# Patient Record
Sex: Male | Born: 1985 | Race: Black or African American | Hispanic: No | Marital: Married | Smoking: Current every day smoker
Health system: Southern US, Community
[De-identification: ages and names within clinical notes are randomized; demographics above are authoritative.]

## PROBLEM LIST (undated history)

## (undated) DIAGNOSIS — K219 Gastro-esophageal reflux disease without esophagitis: Secondary | ICD-10-CM

## (undated) HISTORY — PX: COLOSTOMY: SHX63

## (undated) HISTORY — PX: ABDOMINAL SURGERY: SHX537

---
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
## 2006-10-11 ENCOUNTER — Emergency Department (HOSPITAL_COMMUNITY): Admission: EM | Admit: 2006-10-11 | Discharge: 2006-10-11 | Payer: Self-pay | Admitting: Family Medicine

## 2009-02-21 ENCOUNTER — Ambulatory Visit: Payer: Self-pay | Admitting: Internal Medicine

## 2009-02-21 ENCOUNTER — Encounter: Payer: Self-pay | Admitting: Internal Medicine

## 2009-02-21 DIAGNOSIS — F319 Bipolar disorder, unspecified: Secondary | ICD-10-CM | POA: Insufficient documentation

## 2009-02-21 DIAGNOSIS — M25569 Pain in unspecified knee: Secondary | ICD-10-CM

## 2009-02-21 DIAGNOSIS — J45909 Unspecified asthma, uncomplicated: Secondary | ICD-10-CM | POA: Insufficient documentation

## 2009-02-21 DIAGNOSIS — G40309 Generalized idiopathic epilepsy and epileptic syndromes, not intractable, without status epilepticus: Secondary | ICD-10-CM | POA: Insufficient documentation

## 2009-02-21 LAB — CONVERTED CEMR LAB
ALT: 23 units/L (ref 0–53)
AST: 27 units/L (ref 0–37)
Albumin: 5 g/dL (ref 3.5–5.2)
Alkaline Phosphatase: 48 units/L (ref 39–117)
BUN: 11 mg/dL (ref 6–23)
CO2: 23 meq/L (ref 19–32)
Calcium: 9.8 mg/dL (ref 8.4–10.5)
Chloride: 105 meq/L (ref 96–112)
Creatinine, Ser: 1.24 mg/dL (ref 0.40–1.50)
GFR calc Af Amer: >60 mL/min (ref >60)
GFR calc non Af Amer: >60 mL/min (ref >60)
Glucose, Bld: 89 mg/dL (ref 70–99)
HCT: 46.2 % (ref 39.0–52.0)
Hemoglobin: 15.4 g/dL (ref 13.0–17.0)
MCHC: 33.3 g/dL (ref 30.0–36.0)
MCV: 89.4 fL (ref 78.0–100.0)
Platelets: 347 10*3/uL (ref 150–400)
Potassium: 4.8 meq/L (ref 3.5–5.3)
RBC: 5.17 M/uL (ref 4.22–5.81)
RDW: 14.2 % (ref 11.5–15.5)
Rheumatoid fact SerPl-aCnc: <20 intl units/mL (ref 0–20)
Sed Rate: 2 mm/hr (ref 0–16)
Sodium: 141 meq/L (ref 135–145)
TSH: 1.164 microintl units/mL (ref 0.350–4.500)
Total Bilirubin: 0.2 mg/dL — ABNORMAL LOW (ref 0.3–1.2)
Total Protein: 8.5 g/dL — ABNORMAL HIGH (ref 6.0–8.3)
WBC: 6.6 10*3/uL (ref 4.0–10.5)

## 2009-03-24 ENCOUNTER — Encounter: Payer: Self-pay | Admitting: Internal Medicine

## 2009-04-10 ENCOUNTER — Encounter: Payer: Self-pay | Admitting: Internal Medicine

## 2009-04-28 ENCOUNTER — Emergency Department (HOSPITAL_COMMUNITY): Admission: EM | Admit: 2009-04-28 | Discharge: 2009-04-28 | Payer: Self-pay | Admitting: Family Medicine

## 2011-03-30 ENCOUNTER — Emergency Department (HOSPITAL_COMMUNITY): Payer: Self-pay

## 2011-03-30 ENCOUNTER — Emergency Department (HOSPITAL_COMMUNITY)
Admission: EM | Admit: 2011-03-30 | Discharge: 2011-03-30 | Disposition: A | Discharge status: Home / Self Care | Payer: Self-pay | Attending: Emergency Medicine | Admitting: Emergency Medicine

## 2011-03-30 DIAGNOSIS — F1010 Alcohol abuse, uncomplicated: Secondary | ICD-10-CM | POA: Insufficient documentation

## 2011-03-30 DIAGNOSIS — M62838 Other muscle spasm: Secondary | ICD-10-CM | POA: Insufficient documentation

## 2011-03-30 DIAGNOSIS — IMO0002 Reserved for concepts with insufficient information to code with codable children: Secondary | ICD-10-CM | POA: Insufficient documentation

## 2011-03-30 DIAGNOSIS — W1809XA Striking against other object with subsequent fall, initial encounter: Secondary | ICD-10-CM | POA: Insufficient documentation

## 2011-03-30 DIAGNOSIS — R000 Tachycardia, unspecified: Secondary | ICD-10-CM | POA: Insufficient documentation

## 2011-04-03 ENCOUNTER — Other Ambulatory Visit (INDEPENDENT_AMBULATORY_CARE_PROVIDER_SITE_OTHER): Payer: Self-pay | Admitting: General Surgery

## 2011-04-03 ENCOUNTER — Emergency Department (HOSPITAL_COMMUNITY): Payer: No Typology Code available for payment source

## 2011-04-03 ENCOUNTER — Inpatient Hospital Stay (HOSPITAL_COMMUNITY)
Admission: EM | Admit: 2011-04-03 | Discharge: 2011-04-15 | DRG: 329 | Disposition: A | Discharge status: Home / Self Care | Payer: No Typology Code available for payment source | Attending: General Surgery | Admitting: General Surgery

## 2011-04-03 DIAGNOSIS — D62 Acute posthemorrhagic anemia: Secondary | ICD-10-CM | POA: Diagnosis present

## 2011-04-03 DIAGNOSIS — W3400XA Accidental discharge from unspecified firearms or gun, initial encounter: Secondary | ICD-10-CM

## 2011-04-03 DIAGNOSIS — Y998 Other external cause status: Secondary | ICD-10-CM

## 2011-04-03 DIAGNOSIS — S31809A Unspecified open wound of unspecified buttock, initial encounter: Secondary | ICD-10-CM | POA: Diagnosis present

## 2011-04-03 DIAGNOSIS — F17200 Nicotine dependence, unspecified, uncomplicated: Secondary | ICD-10-CM | POA: Diagnosis present

## 2011-04-03 DIAGNOSIS — S81009A Unspecified open wound, unspecified knee, initial encounter: Secondary | ICD-10-CM | POA: Diagnosis present

## 2011-04-03 DIAGNOSIS — S61409A Unspecified open wound of unspecified hand, initial encounter: Secondary | ICD-10-CM | POA: Diagnosis present

## 2011-04-03 DIAGNOSIS — K560 Paralytic ileus: Secondary | ICD-10-CM | POA: Diagnosis present

## 2011-04-03 DIAGNOSIS — K661 Hemoperitoneum: Secondary | ICD-10-CM | POA: Diagnosis present

## 2011-04-03 DIAGNOSIS — S36899A Unspecified injury of other intra-abdominal organs, initial encounter: Principal | ICD-10-CM | POA: Diagnosis present

## 2011-04-03 DIAGNOSIS — M795 Residual foreign body in soft tissue: Secondary | ICD-10-CM | POA: Diagnosis present

## 2011-04-03 DIAGNOSIS — S81809A Unspecified open wound, unspecified lower leg, initial encounter: Secondary | ICD-10-CM | POA: Diagnosis present

## 2011-04-03 DIAGNOSIS — S62309A Unspecified fracture of unspecified metacarpal bone, initial encounter for closed fracture: Secondary | ICD-10-CM | POA: Diagnosis present

## 2011-04-03 DIAGNOSIS — S31109A Unspecified open wound of abdominal wall, unspecified quadrant without penetration into peritoneal cavity, initial encounter: Secondary | ICD-10-CM | POA: Diagnosis present

## 2011-04-03 DIAGNOSIS — Z189 Retained foreign body fragments, unspecified material: Secondary | ICD-10-CM

## 2011-04-03 DIAGNOSIS — R000 Tachycardia, unspecified: Secondary | ICD-10-CM | POA: Diagnosis present

## 2011-04-03 LAB — COMPREHENSIVE METABOLIC PANEL
ALT: 27 U/L (ref 0–53)
Albumin: 3.3 g/dL — ABNORMAL LOW (ref 3.5–5.2)
Alkaline Phosphatase: 40 U/L (ref 39–117)
Potassium: 3 meq/L — ABNORMAL LOW (ref 3.5–5.1)
Sodium: 141 meq/L (ref 135–145)
Total Protein: 6 g/dL (ref 6.0–8.3)

## 2011-04-03 LAB — PROTIME-INR
INR: 1.09 (ref 0.00–1.49)
Prothrombin Time: 14.3 s (ref 11.6–15.2)

## 2011-04-03 LAB — POCT I-STAT, CHEM 8
HCT: 37 % — ABNORMAL LOW (ref 39.0–52.0)
Hemoglobin: 12.6 g/dL — ABNORMAL LOW (ref 13.0–17.0)
Potassium: 3.1 meq/L — ABNORMAL LOW (ref 3.5–5.1)
Sodium: 144 meq/L (ref 135–145)

## 2011-04-03 LAB — CBC
Hemoglobin: 11.5 g/dL — ABNORMAL LOW (ref 13.0–17.0)
RBC: 3.88 MIL/uL — ABNORMAL LOW (ref 4.22–5.81)
WBC: 8.5 10*3/uL (ref 4.0–10.5)

## 2011-04-04 ENCOUNTER — Inpatient Hospital Stay (HOSPITAL_COMMUNITY): Payer: No Typology Code available for payment source

## 2011-04-04 ENCOUNTER — Telehealth: Payer: Self-pay | Admitting: Internal Medicine

## 2011-04-04 DIAGNOSIS — S36409A Unspecified injury of unspecified part of small intestine, initial encounter: Secondary | ICD-10-CM

## 2011-04-04 DIAGNOSIS — S36509A Unspecified injury of unspecified part of colon, initial encounter: Secondary | ICD-10-CM

## 2011-04-04 LAB — BASIC METABOLIC PANEL
BUN: 8 mg/dL (ref 6–23)
CO2: 22 mEq/L (ref 19–32)
GFR calc non Af Amer: >60 mL/min (ref >60)
Glucose, Bld: 116 mg/dL — ABNORMAL HIGH (ref 70–99)
Potassium: 4 mEq/L (ref 3.5–5.1)

## 2011-04-04 LAB — CBC
HCT: 33.2 % — ABNORMAL LOW (ref 39.0–52.0)
Hemoglobin: 11.6 g/dL — ABNORMAL LOW (ref 13.0–17.0)
MCH: 30.2 pg (ref 26.0–34.0)
MCHC: 34.9 g/dL (ref 30.0–36.0)
RBC: 3.84 MIL/uL — ABNORMAL LOW (ref 4.22–5.81)

## 2011-04-04 LAB — ABO/RH: ABO/RH(D): A POS
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
## 2011-04-05 LAB — CBC
HCT: 30.2 % — ABNORMAL LOW (ref 39.0–52.0)
MCH: 30.4 pg (ref 26.0–34.0)
MCV: 85.8 fL (ref 78.0–100.0)
Platelets: 134 10*3/uL — ABNORMAL LOW (ref 150–400)
RBC: 3.52 MIL/uL — ABNORMAL LOW (ref 4.22–5.81)

## 2011-04-06 DIAGNOSIS — D62 Acute posthemorrhagic anemia: Secondary | ICD-10-CM

## 2011-04-06 LAB — DIFFERENTIAL
Eosinophils Absolute: 0.1 10*3/uL (ref 0.0–0.7)
Eosinophils Relative: 1 % (ref 0–5)
Lymphs Abs: 1.2 10*3/uL (ref 0.7–4.0)
Monocytes Absolute: 0.6 10*3/uL (ref 0.1–1.0)
Monocytes Relative: 7 % (ref 3–12)

## 2011-04-06 LAB — CBC
HCT: 25.4 % — ABNORMAL LOW (ref 39.0–52.0)
MCH: 30.2 pg (ref 26.0–34.0)
MCHC: 35.4 g/dL (ref 30.0–36.0)
MCV: 85.2 fL (ref 78.0–100.0)
Platelets: 141 10*3/uL — ABNORMAL LOW (ref 150–400)
RDW: 13.4 % (ref 11.5–15.5)
WBC: 8.5 10*3/uL (ref 4.0–10.5)

## 2011-04-06 LAB — POCT I-STAT 4, (NA,K, GLUC, HGB,HCT): Sodium: 144 meq/L (ref 135–145)

## 2011-04-06 LAB — BASIC METABOLIC PANEL
BUN: 5 mg/dL — ABNORMAL LOW (ref 6–23)
Calcium: 8.6 mg/dL (ref 8.4–10.5)
Creatinine, Ser: 0.98 mg/dL (ref 0.50–1.35)
GFR calc Af Amer: >60 mL/min (ref >60)

## 2011-04-07 LAB — TYPE AND SCREEN
ABO/RH(D): A POS
Antibody Screen: NEGATIVE
Unit division: 0
Unit division: 0
Unit division: 0
Unit division: 0

## 2011-04-07 LAB — CBC
HCT: 24.9 % — ABNORMAL LOW (ref 39.0–52.0)
Hemoglobin: 8.5 g/dL — ABNORMAL LOW (ref 13.0–17.0)
MCH: 29.3 pg (ref 26.0–34.0)
MCHC: 34.1 g/dL (ref 30.0–36.0)
RBC: 2.9 MIL/uL — ABNORMAL LOW (ref 4.22–5.81)

## 2011-04-08 LAB — CBC
HCT: 25.6 % — ABNORMAL LOW (ref 39.0–52.0)
MCH: 30.1 pg (ref 26.0–34.0)
MCV: 85.6 fL (ref 78.0–100.0)
Platelets: ADEQUATE 10*3/uL (ref 150–400)
RBC: 2.99 MIL/uL — ABNORMAL LOW (ref 4.22–5.81)
RDW: 13.1 % (ref 11.5–15.5)
WBC: 7.6 10*3/uL (ref 4.0–10.5)

## 2011-04-09 ENCOUNTER — Inpatient Hospital Stay (HOSPITAL_COMMUNITY): Payer: No Typology Code available for payment source

## 2011-04-11 ENCOUNTER — Inpatient Hospital Stay (HOSPITAL_COMMUNITY): Payer: No Typology Code available for payment source

## 2011-04-11 LAB — CBC
HCT: 28.5 % — ABNORMAL LOW (ref 39.0–52.0)
Hemoglobin: 9.7 g/dL — ABNORMAL LOW (ref 13.0–17.0)
MCH: 29.5 pg (ref 26.0–34.0)
MCHC: 34 g/dL (ref 30.0–36.0)
RDW: 13.4 % (ref 11.5–15.5)

## 2011-04-11 LAB — BASIC METABOLIC PANEL
Calcium: 9.8 mg/dL (ref 8.4–10.5)
Chloride: 99 mEq/L (ref 96–112)
Creatinine, Ser: 1.11 mg/dL (ref 0.50–1.35)
GFR calc Af Amer: >60 mL/min (ref >60)
Sodium: 140 mEq/L (ref 135–145)

## 2011-04-11 LAB — URINALYSIS, ROUTINE W REFLEX MICROSCOPIC
Hgb urine dipstick: NEGATIVE
Specific Gravity, Urine: 1.021 (ref 1.005–1.030)
Urobilinogen, UA: 1 mg/dL (ref 0.0–1.0)
pH: 8.5 — ABNORMAL HIGH (ref 5.0–8.0)

## 2011-04-11 LAB — URINE MICROSCOPIC-ADD ON

## 2011-04-12 ENCOUNTER — Inpatient Hospital Stay (HOSPITAL_COMMUNITY): Payer: No Typology Code available for payment source

## 2011-04-12 LAB — BASIC METABOLIC PANEL
CO2: 29 mEq/L (ref 19–32)
Calcium: 9.9 mg/dL (ref 8.4–10.5)
Creatinine, Ser: 1.05 mg/dL (ref 0.50–1.35)
Glucose, Bld: 136 mg/dL — ABNORMAL HIGH (ref 70–99)

## 2011-04-12 LAB — CBC
HCT: 30.2 % — ABNORMAL LOW (ref 39.0–52.0)
Platelets: 706 10*3/uL — ABNORMAL HIGH (ref 150–400)
RBC: 3.47 MIL/uL — ABNORMAL LOW (ref 4.22–5.81)
RDW: 13.7 % (ref 11.5–15.5)
WBC: 11.3 10*3/uL — ABNORMAL HIGH (ref 4.0–10.5)

## 2011-04-12 LAB — URINE CULTURE: Culture  Setup Time: 201208051431
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
## 2011-04-14 LAB — CBC
MCH: 29.6 pg (ref 26.0–34.0)
MCHC: 34 g/dL (ref 30.0–36.0)
MCV: 87.2 fL (ref 78.0–100.0)
Platelets: 645 10*3/uL — ABNORMAL HIGH (ref 150–400)
RDW: 13.7 % (ref 11.5–15.5)

## 2011-04-14 LAB — COMPREHENSIVE METABOLIC PANEL
AST: 26 U/L (ref 0–37)
Albumin: 2.8 g/dL — ABNORMAL LOW (ref 3.5–5.2)
Calcium: 8.9 mg/dL (ref 8.4–10.5)
Creatinine, Ser: 0.91 mg/dL (ref 0.50–1.35)
GFR calc non Af Amer: >60 mL/min (ref >60)
Total Protein: 5.9 g/dL — ABNORMAL LOW (ref 6.0–8.3)
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
## 2011-04-29 ENCOUNTER — Ambulatory Visit (INDEPENDENT_AMBULATORY_CARE_PROVIDER_SITE_OTHER): Payer: No Typology Code available for payment source | Admitting: Physician Assistant

## 2011-04-29 DIAGNOSIS — S36509A Unspecified injury of unspecified part of colon, initial encounter: Secondary | ICD-10-CM

## 2011-04-29 DIAGNOSIS — IMO0002 Reserved for concepts with insufficient information to code with codable children: Secondary | ICD-10-CM

## 2011-04-29 DIAGNOSIS — M792 Neuralgia and neuritis, unspecified: Secondary | ICD-10-CM | POA: Insufficient documentation

## 2011-04-29 MED ORDER — OXYCODONE-ACETAMINOPHEN 5-325 MG PO TABS: 1.0000 | ORAL_TABLET | ORAL | Status: DC | PRN

## 2011-04-29 MED ORDER — GABAPENTIN 300 MG PO CAPS: 300.0000 mg | ORAL_CAPSULE | Freq: Three times a day (TID) | ORAL | Status: DC
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
## 2011-04-29 NOTE — Patient Instructions (Signed)
Start Neurontin for nerve pain down the Right leg.  Follow up with Trauma Service in late October for possible colostomy reversal  Call [phone] for appointment.

## 2011-05-11 ENCOUNTER — Telehealth (INDEPENDENT_AMBULATORY_CARE_PROVIDER_SITE_OTHER): Payer: Self-pay | Admitting: Orthopedic Surgery

## 2011-05-11 NOTE — Telephone Encounter (Signed)
Had questions regarding passing gas and stool out of rectum with a colostomy.

## 2011-05-11 NOTE — Telephone Encounter (Signed)
Called with concerns regarding gas and stool per rectum. I explained these were normal, pt was reassured.

## 2011-06-11 ENCOUNTER — Telehealth (INDEPENDENT_AMBULATORY_CARE_PROVIDER_SITE_OTHER): Payer: Self-pay | Admitting: Physician Assistant
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
## 2011-06-18 ENCOUNTER — Telehealth (INDEPENDENT_AMBULATORY_CARE_PROVIDER_SITE_OTHER): Payer: Self-pay | Admitting: Orthopedic Surgery

## 2011-06-18 NOTE — Telephone Encounter (Signed)
Patient's mother called to set up appointment for possible colostomy reversal. I saw where she had been told to call in October but I spoke with Dr. Janee Morn about it and he wanted to wait the full 6 months. He suggested appointment in late December with plan to operate sometime in January. I communicated this to the patient's mother and apologized for the confusion.

## 2011-06-21 ENCOUNTER — Telehealth (INDEPENDENT_AMBULATORY_CARE_PROVIDER_SITE_OTHER): Payer: Self-pay | Admitting: Orthopedic Surgery
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
## 2011-08-24 ENCOUNTER — Telehealth: Payer: Self-pay | Admitting: Orthopedic Surgery

## 2011-08-24 NOTE — Telephone Encounter (Signed)
Left message for pt to call back. I made him an appointment with Dr. Janee Morn for January 16th @ 9:30 to discuss colostomy reversal. He will need to speak with financial counseling at [facility] prior to that appointment.

## 2011-09-08 ENCOUNTER — Telehealth: Payer: Self-pay | Admitting: Orthopedic Surgery

## 2011-09-08 NOTE — Telephone Encounter (Signed)
Called about colostomy reversal

## 2011-09-10 ENCOUNTER — Telehealth: Payer: Self-pay | Admitting: Orthopedic Surgery

## 2011-09-10 NOTE — Telephone Encounter (Signed)
Left message to call [facility] to change appointment times.

## 2011-09-22 ENCOUNTER — Encounter (INDEPENDENT_AMBULATORY_CARE_PROVIDER_SITE_OTHER): Payer: Self-pay | Admitting: General Surgery

## 2011-09-22 ENCOUNTER — Ambulatory Visit (INDEPENDENT_AMBULATORY_CARE_PROVIDER_SITE_OTHER): Payer: No Typology Code available for payment source | Admitting: General Surgery

## 2011-09-22 VITALS — BP 108/56 | HR 74 | Temp 99.1°F | Ht 70.0 in | Wt 185.2 lb

## 2011-09-22 DIAGNOSIS — Z933 Colostomy status: Secondary | ICD-10-CM
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
## 2011-09-28 ENCOUNTER — Ambulatory Visit
Admission: RE | Admit: 2011-09-28 | Discharge: 2011-09-28 | Disposition: A | Discharge status: Home / Self Care | Payer: No Typology Code available for payment source | Source: Ambulatory Visit | Attending: General Surgery | Admitting: General Surgery

## 2011-09-28 DIAGNOSIS — Z933 Colostomy status: Secondary | ICD-10-CM

## 2011-09-30 ENCOUNTER — Other Ambulatory Visit (INDEPENDENT_AMBULATORY_CARE_PROVIDER_SITE_OTHER): Payer: Self-pay | Admitting: General Surgery

## 2011-10-04 ENCOUNTER — Telehealth (INDEPENDENT_AMBULATORY_CARE_PROVIDER_SITE_OTHER): Payer: Self-pay
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
## 2011-11-19 ENCOUNTER — Other Ambulatory Visit (HOSPITAL_COMMUNITY): Payer: Self-pay

## 2011-11-19 ENCOUNTER — Telehealth: Payer: Self-pay | Admitting: Orthopedic Surgery

## 2011-11-19 ENCOUNTER — Telehealth (INDEPENDENT_AMBULATORY_CARE_PROVIDER_SITE_OTHER): Payer: Self-pay
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
## 2011-11-25 ENCOUNTER — Encounter (INDEPENDENT_AMBULATORY_CARE_PROVIDER_SITE_OTHER): Payer: Self-pay | Admitting: Orthopedic Surgery

## 2011-11-25 ENCOUNTER — Ambulatory Visit (INDEPENDENT_AMBULATORY_CARE_PROVIDER_SITE_OTHER): Payer: Self-pay | Admitting: Orthopedic Surgery

## 2011-11-25 VITALS — BP 130/84 | HR 70 | Temp 97.8°F | Resp 18 | Ht 69.0 in | Wt 192.2 lb

## 2011-11-25 DIAGNOSIS — IMO0002 Reserved for concepts with insufficient information to code with codable children: Secondary | ICD-10-CM

## 2011-11-25 DIAGNOSIS — L089 Local infection of the skin and subcutaneous tissue, unspecified: Secondary | ICD-10-CM

## 2011-11-25 MED ORDER — ACETAMINOPHEN 325 MG PO TABS: 650.0000 mg | ORAL_TABLET | ORAL | Status: DC | PRN

## 2011-11-25 MED ORDER — CEPHALEXIN 500 MG PO CAPS: 500.0000 mg | ORAL_CAPSULE | Freq: Four times a day (QID) | ORAL | Status: AC

## 2011-11-25 MED ORDER — HYDROCODONE-ACETAMINOPHEN 5-325 MG PO TABS: 1.0000 | ORAL_TABLET | Freq: Four times a day (QID) | ORAL | Status: AC | PRN

## 2011-11-25 NOTE — Patient Instructions (Signed)
Twice daily, remove packing and wash with soap and water. Do not soak. Then replace packing with 1/4" plain gauze and cover with dry dressing.
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
## 2011-12-02 ENCOUNTER — Ambulatory Visit (INDEPENDENT_AMBULATORY_CARE_PROVIDER_SITE_OTHER): Payer: No Typology Code available for payment source | Admitting: Physician Assistant

## 2011-12-02 VITALS — BP 120/84 | HR 70 | Temp 97.8°F | Resp 18 | Ht 70.0 in | Wt 189.4 lb

## 2011-12-02 DIAGNOSIS — S70259A Superficial foreign body, unspecified hip, initial encounter: Secondary | ICD-10-CM

## 2011-12-02 DIAGNOSIS — S70251A Superficial foreign body, right hip, initial encounter: Secondary | ICD-10-CM
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
## 2011-12-02 NOTE — Patient Instructions (Signed)
Dry dressing over right hip until healed.

## 2011-12-07 ENCOUNTER — Encounter (HOSPITAL_COMMUNITY): Payer: Self-pay | Admitting: Pharmacy Technician

## 2011-12-10 NOTE — Pre-Procedure Instructions (Signed)
20 Phillip Mcgee  12/10/2011   Your procedure is scheduled on:  April 15  Report to [facility] at 0530 AM.  Call this number if you have problems the morning of surgery: [phone]   Remember:   Do not eat food:After Midnight.  May have clear liquids: up to 4 Hours before arrival.  Clear liquids include soda, tea, black coffee, apple or grape juice, broth.  Take these medicines the morning of surgery with A SIP OF WATER: oxycodone   Do not wear jewelry, make-up or nail polish.  Do not wear lotions, powders, or perfumes. You may wear deodorant.  Do not shave 48 hours prior to surgery.  Do not bring valuables to the hospital.  Contacts, dentures or bridgework may not be worn into surgery.  Leave suitcase in the car. After surgery it may be brought to your room.  For patients admitted to the hospital, checkout time is 11:00 AM the day of discharge.   Patients discharged the day of surgery will not be allowed to drive home.  Name and phone number of your driver: family  Special Instructions: CHG Shower Use Special Wash: 1/2 bottle night before surgery and 1/2 bottle morning of surgery.   Please read over the following fact sheets that you were given: Coughing and Deep Breathing, MRSA Information and Surgical Site Infection Prevention

## 2011-12-13 ENCOUNTER — Telehealth (INDEPENDENT_AMBULATORY_CARE_PROVIDER_SITE_OTHER): Payer: Self-pay

## 2011-12-13 ENCOUNTER — Inpatient Hospital Stay (HOSPITAL_COMMUNITY): Admission: RE | Admit: 2011-12-13 | Discharge: 2011-12-13 | Payer: Self-pay | Source: Ambulatory Visit

## 2011-12-13 ENCOUNTER — Other Ambulatory Visit: Payer: Self-pay | Admitting: General Surgery

## 2011-12-13 NOTE — Telephone Encounter (Signed)
I called the pt to check and see if he had his bowel prep information and prescriptions.  He does and has had a hard time finding a Pharmacy that has the Golytely but [facility] does.  He will get that filled.

## 2011-12-13 NOTE — Progress Notes (Signed)
Requested new current orders for preop. (orders from 09/2011 in epic).

## 2011-12-16 ENCOUNTER — Encounter (HOSPITAL_COMMUNITY): Payer: Self-pay

## 2011-12-16 ENCOUNTER — Telehealth (INDEPENDENT_AMBULATORY_CARE_PROVIDER_SITE_OTHER): Payer: Self-pay

## 2011-12-16 ENCOUNTER — Encounter (HOSPITAL_COMMUNITY)
Admission: RE | Admit: 2011-12-16 | Discharge: 2011-12-16 | Disposition: A | Discharge status: Home / Self Care | Payer: Self-pay | Source: Ambulatory Visit | Attending: General Surgery | Admitting: General Surgery

## 2011-12-16 LAB — BASIC METABOLIC PANEL
Calcium: 9.9 mg/dL (ref 8.4–10.5)
Chloride: 102 mEq/L (ref 96–112)
Creatinine, Ser: 0.95 mg/dL (ref 0.50–1.35)
GFR calc Af Amer: >90 mL/min (ref >90)

## 2011-12-16 LAB — CBC
Platelets: 287 10*3/uL (ref 150–400)
RDW: 13.9 % (ref 11.5–15.5)
WBC: 8.8 10*3/uL (ref 4.0–10.5)

## 2011-12-16 LAB — SURGICAL PCR SCREEN: Staphylococcus aureus: NEGATIVE

## 2011-12-16 NOTE — Telephone Encounter (Signed)
Pt request the golytley be called to [facility] [address] in [place] [phone]. Pt states he did pick up the antibiotics at [facility] but they did not have colon prep. Pt request we call it in to [place] instead. Golytley colon prep called to [facility] with instruction to begin prep at 8am the day before surgery.

## 2011-12-19 MED ORDER — SODIUM CHLORIDE 0.9 % IV SOLN
1.0000 g | INTRAVENOUS | Status: AC
  Administered 2011-12-20: 1 g via INTRAVENOUS
  Filled 2011-12-19: qty 1

## 2011-12-19 MED ORDER — ALVIMOPAN 12 MG PO CAPS
12.0000 mg | ORAL_CAPSULE | Freq: Once | ORAL | Status: AC
  Administered 2011-12-20: 12 mg via ORAL
  Filled 2011-12-19: qty 1

## 2011-12-20 ENCOUNTER — Encounter (HOSPITAL_COMMUNITY): Admission: RE | Disposition: A | Discharge status: Home / Self Care | Payer: Self-pay | Source: Ambulatory Visit

## 2011-12-20 ENCOUNTER — Encounter (HOSPITAL_COMMUNITY): Payer: Self-pay

## 2011-12-20 ENCOUNTER — Inpatient Hospital Stay (HOSPITAL_COMMUNITY)
Admission: RE | Admit: 2011-12-20 | Discharge: 2011-12-25 | DRG: 345 | Disposition: A | Discharge status: Home / Self Care | Payer: Self-pay | Source: Ambulatory Visit | Attending: General Surgery | Admitting: General Surgery

## 2011-12-20 ENCOUNTER — Encounter (HOSPITAL_COMMUNITY): Payer: Self-pay | Admitting: Anesthesiology

## 2011-12-20 ENCOUNTER — Ambulatory Visit (HOSPITAL_COMMUNITY): Payer: Self-pay | Admitting: Anesthesiology

## 2011-12-20 DIAGNOSIS — F319 Bipolar disorder, unspecified: Secondary | ICD-10-CM | POA: Diagnosis present

## 2011-12-20 DIAGNOSIS — Z433 Encounter for attention to colostomy: Principal | ICD-10-CM | POA: Diagnosis present

## 2011-12-20 DIAGNOSIS — IMO0002 Reserved for concepts with insufficient information to code with codable children: Secondary | ICD-10-CM

## 2011-12-20 DIAGNOSIS — J45909 Unspecified asthma, uncomplicated: Secondary | ICD-10-CM | POA: Diagnosis present

## 2011-12-20 DIAGNOSIS — M25569 Pain in unspecified knee: Secondary | ICD-10-CM | POA: Diagnosis present

## 2011-12-20 DIAGNOSIS — K929 Disease of digestive system, unspecified: Secondary | ICD-10-CM | POA: Diagnosis not present

## 2011-12-20 DIAGNOSIS — F17200 Nicotine dependence, unspecified, uncomplicated: Secondary | ICD-10-CM | POA: Diagnosis present

## 2011-12-20 DIAGNOSIS — G589 Mononeuropathy, unspecified: Secondary | ICD-10-CM | POA: Diagnosis present

## 2011-12-20 DIAGNOSIS — K560 Paralytic ileus: Secondary | ICD-10-CM | POA: Diagnosis not present

## 2011-12-20 DIAGNOSIS — Y832 Surgical operation with anastomosis, bypass or graft as the cause of abnormal reaction of the patient, or of later complication, without mention of misadventure at the time of the procedure: Secondary | ICD-10-CM | POA: Diagnosis not present

## 2011-12-20 DIAGNOSIS — Z01812 Encounter for preprocedural laboratory examination: Secondary | ICD-10-CM

## 2011-12-20 HISTORY — DX: Gastro-esophageal reflux disease without esophagitis: K21.9

## 2011-12-20 HISTORY — PX: COLOSTOMY CLOSURE: SHX1381

## 2011-12-20 SURGERY — COLOSTOMY CLOSURE
Anesthesia: General | Site: Abdomen | Wound class: Clean Contaminated

## 2011-12-20 MED ORDER — HYDROMORPHONE 0.3 MG/ML IV SOLN
INTRAVENOUS | Status: AC
  Filled 2011-12-20: qty 25

## 2011-12-20 MED ORDER — ONDANSETRON HCL 4 MG/2ML IJ SOLN
INTRAMUSCULAR | Status: DC | PRN
  Administered 2011-12-20: 4 mg via INTRAVENOUS

## 2011-12-20 MED ORDER — MORPHINE SULFATE 10 MG/ML IJ SOLN
INTRAMUSCULAR | Status: DC | PRN
  Administered 2011-12-20: 1 mg via INTRAVENOUS
  Administered 2011-12-20: 5 mg via INTRAVENOUS
  Administered 2011-12-20 (×2): 2 mg via INTRAVENOUS

## 2011-12-20 MED ORDER — DIPHENHYDRAMINE HCL 12.5 MG/5ML PO ELIX
12.5000 mg | ORAL_SOLUTION | Freq: Four times a day (QID) | ORAL | Status: DC | PRN
  Filled 2011-12-20: qty 5

## 2011-12-20 MED ORDER — LACTATED RINGERS IV SOLN
INTRAVENOUS | Status: DC | PRN
  Administered 2011-12-20 (×2): via INTRAVENOUS

## 2011-12-20 MED ORDER — ONDANSETRON HCL 4 MG/2ML IJ SOLN: 4.0000 mg | Freq: Once | INTRAMUSCULAR | Status: DC | PRN

## 2011-12-20 MED ORDER — SODIUM CHLORIDE 0.9 % IV SOLN
1.0000 g | INTRAVENOUS | Status: DC
  Filled 2011-12-20: qty 1

## 2011-12-20 MED ORDER — ONDANSETRON HCL 4 MG PO TABS: 4.0000 mg | ORAL_TABLET | Freq: Four times a day (QID) | ORAL | Status: DC | PRN

## 2011-12-20 MED ORDER — LIDOCAINE HCL (CARDIAC) 20 MG/ML IV SOLN
INTRAVENOUS | Status: DC | PRN
  Administered 2011-12-20: 60 mg via INTRAVENOUS

## 2011-12-20 MED ORDER — DEXTROSE IN LACTATED RINGERS 5 % IV SOLN
INTRAVENOUS | Status: DC
  Administered 2011-12-20: 18:00:00 via INTRAVENOUS
  Administered 2011-12-21 (×2): 125 mL/h via INTRAVENOUS
  Administered 2011-12-22 – 2011-12-23 (×4): via INTRAVENOUS
  Administered 2011-12-23: 125 mL/h via INTRAVENOUS
  Administered 2011-12-23 – 2011-12-24 (×2): via INTRAVENOUS
  Administered 2011-12-24: 125 mL/h via INTRAVENOUS
  Administered 2011-12-25: 09:00:00 via INTRAVENOUS

## 2011-12-20 MED ORDER — NALOXONE HCL 0.4 MG/ML IJ SOLN: 0.4000 mg | INTRAMUSCULAR | Status: DC | PRN

## 2011-12-20 MED ORDER — ALVIMOPAN 12 MG PO CAPS
12.0000 mg | ORAL_CAPSULE | Freq: Two times a day (BID) | ORAL | Status: DC
  Administered 2011-12-21 – 2011-12-25 (×9): 12 mg via ORAL
  Filled 2011-12-20 (×10): qty 1

## 2011-12-20 MED ORDER — HYDROMORPHONE HCL PF 1 MG/ML IJ SOLN
INTRAMUSCULAR | Status: AC
  Filled 2011-12-20: qty 1

## 2011-12-20 MED ORDER — ONDANSETRON HCL 4 MG/2ML IJ SOLN: 4.0000 mg | Freq: Four times a day (QID) | INTRAMUSCULAR | Status: DC | PRN

## 2011-12-20 MED ORDER — GLYCOPYRROLATE 0.2 MG/ML IJ SOLN
INTRAMUSCULAR | Status: DC | PRN
  Administered 2011-12-20: .6 mg via INTRAVENOUS

## 2011-12-20 MED ORDER — EPHEDRINE SULFATE 50 MG/ML IJ SOLN
INTRAMUSCULAR | Status: DC | PRN
  Administered 2011-12-20: 5 mg via INTRAVENOUS
  Administered 2011-12-20: 10 mg via INTRAVENOUS
  Administered 2011-12-20 (×2): 5 mg via INTRAVENOUS

## 2011-12-20 MED ORDER — NEOSTIGMINE METHYLSULFATE 1 MG/ML IJ SOLN
INTRAMUSCULAR | Status: DC | PRN
  Administered 2011-12-20: 4 mg via INTRAVENOUS

## 2011-12-20 MED ORDER — PROPOFOL 10 MG/ML IV EMUL
INTRAVENOUS | Status: DC | PRN
  Administered 2011-12-20: 200 mg via INTRAVENOUS

## 2011-12-20 MED ORDER — FENTANYL CITRATE 0.05 MG/ML IJ SOLN
INTRAMUSCULAR | Status: DC | PRN
  Administered 2011-12-20 (×2): 50 ug via INTRAVENOUS
  Administered 2011-12-20: 150 ug via INTRAVENOUS

## 2011-12-20 MED ORDER — ALVIMOPAN 12 MG PO CAPS
12.0000 mg | ORAL_CAPSULE | Freq: Two times a day (BID) | ORAL | Status: DC
  Filled 2011-12-20: qty 1

## 2011-12-20 MED ORDER — MIDAZOLAM HCL 5 MG/5ML IJ SOLN
INTRAMUSCULAR | Status: DC | PRN
  Administered 2011-12-20: 2 mg via INTRAVENOUS

## 2011-12-20 MED ORDER — HYDROMORPHONE 0.3 MG/ML IV SOLN
INTRAVENOUS | Status: DC
  Administered 2011-12-20: 5.7 mg via INTRAVENOUS
  Administered 2011-12-20 (×2): via INTRAVENOUS
  Administered 2011-12-20: 2.1 mg via INTRAVENOUS
  Administered 2011-12-21: 3.3 mg via INTRAVENOUS
  Administered 2011-12-21: 1.2 mg via INTRAVENOUS
  Administered 2011-12-21: 04:00:00 via INTRAVENOUS
  Administered 2011-12-21: 4.2 mg via INTRAVENOUS
  Administered 2011-12-21: 2.1 mg via INTRAVENOUS
  Administered 2011-12-21: 4.2 mg via INTRAVENOUS
  Administered 2011-12-21: 4.5 mg via INTRAVENOUS
  Administered 2011-12-22 – 2011-12-23 (×6): 0.3 mg via INTRAVENOUS

## 2011-12-20 MED ORDER — ENOXAPARIN SODIUM 40 MG/0.4ML ~~LOC~~ SOLN
40.0000 mg | SUBCUTANEOUS | Status: DC
  Administered 2011-12-21 – 2011-12-23 (×3): 40 mg via SUBCUTANEOUS
  Filled 2011-12-20 (×3): qty 0.4

## 2011-12-20 MED ORDER — ALVIMOPAN 12 MG PO CAPS: 12.0000 mg | ORAL_CAPSULE | Freq: Once | ORAL | Status: DC

## 2011-12-20 MED ORDER — DIPHENHYDRAMINE HCL 50 MG/ML IJ SOLN: 12.5000 mg | Freq: Four times a day (QID) | INTRAMUSCULAR | Status: DC | PRN

## 2011-12-20 MED ORDER — SODIUM CHLORIDE 0.9 % IJ SOLN: 9.0000 mL | INTRAMUSCULAR | Status: DC | PRN

## 2011-12-20 MED ORDER — VECURONIUM BROMIDE 10 MG IV SOLR
INTRAVENOUS | Status: DC | PRN
  Administered 2011-12-20: 2 mg via INTRAVENOUS
  Administered 2011-12-20: 5 mg via INTRAVENOUS
  Administered 2011-12-20: 1 mg via INTRAVENOUS
  Administered 2011-12-20: 2 mg via INTRAVENOUS

## 2011-12-20 MED ORDER — ONDANSETRON HCL 4 MG/2ML IJ SOLN
4.0000 mg | Freq: Four times a day (QID) | INTRAMUSCULAR | Status: DC | PRN
  Administered 2011-12-20 – 2011-12-22 (×2): 4 mg via INTRAVENOUS
  Filled 2011-12-20: qty 2

## 2011-12-20 MED ORDER — 0.9 % SODIUM CHLORIDE (POUR BTL) OPTIME
TOPICAL | Status: DC | PRN
  Administered 2011-12-20: 2000 mL

## 2011-12-20 MED ORDER — HYDROMORPHONE 0.3 MG/ML IV SOLN
INTRAVENOUS | Status: AC
  Administered 2011-12-21: 04:00:00
  Filled 2011-12-20: qty 25

## 2011-12-20 MED ORDER — HYDROMORPHONE HCL PF 1 MG/ML IJ SOLN
0.2500 mg | INTRAMUSCULAR | Status: DC | PRN
  Administered 2011-12-20 (×4): 0.5 mg via INTRAVENOUS

## 2011-12-20 MED ORDER — HYDROMORPHONE HCL PF 1 MG/ML IJ SOLN: 0.2500 mg | INTRAMUSCULAR | Status: DC | PRN

## 2011-12-20 SURGICAL SUPPLY — 56 items
BLADE SURG ROTATE 9660 (MISCELLANEOUS) IMPLANT
CANISTER SUCTION 2500CC (MISCELLANEOUS) ×3 IMPLANT
CHLORAPREP W/TINT 26ML (MISCELLANEOUS) ×3 IMPLANT
CLIP TI LARGE 6 (CLIP) IMPLANT
CLOTH BEACON ORANGE TIMEOUT ST (SAFETY) ×3 IMPLANT
COVER SURGICAL LIGHT HANDLE (MISCELLANEOUS) ×3 IMPLANT
DRAPE LAPAROSCOPIC ABDOMINAL (DRAPES) ×3 IMPLANT
DRAPE UTILITY 15X26 W/TAPE STR (DRAPE) ×6 IMPLANT
DRAPE WARM FLUID 44X44 (DRAPE) ×3 IMPLANT
DRESSING TELFA 8X3 (GAUZE/BANDAGES/DRESSINGS) ×2 IMPLANT
ELECT BLADE 6.5 EXT (BLADE) ×3 IMPLANT
ELECT CAUTERY BLADE 6.4 (BLADE) ×3 IMPLANT
ELECT REM PT RETURN 9FT ADLT (ELECTROSURGICAL) ×3
ELECTRODE REM PT RTRN 9FT ADLT (ELECTROSURGICAL) ×2 IMPLANT
GLOVE BIO SURGEON STRL SZ7.5 (GLOVE) ×2 IMPLANT
GLOVE BIO SURGEON STRL SZ8 (GLOVE) ×6 IMPLANT
GLOVE BIOGEL PI IND STRL 7.5 (GLOVE) ×1 IMPLANT
GLOVE BIOGEL PI IND STRL 8 (GLOVE) ×2 IMPLANT
GLOVE BIOGEL PI INDICATOR 7.5 (GLOVE) ×1
GLOVE BIOGEL PI INDICATOR 8 (GLOVE) ×1
GLOVE ECLIPSE 7.5 STRL STRAW (GLOVE) ×2 IMPLANT
GLOVE EUDERMIC 7 POWDERFREE (GLOVE) ×6 IMPLANT
GLOVE SURG SS PI 7.5 STRL IVOR (GLOVE) ×2 IMPLANT
GOWN PREVENTION PLUS XLARGE (GOWN DISPOSABLE) ×5 IMPLANT
GOWN STRL NON-REIN LRG LVL3 (GOWN DISPOSABLE) ×6 IMPLANT
KIT BASIN OR (CUSTOM PROCEDURE TRAY) ×3 IMPLANT
KIT ROOM TURNOVER OR (KITS) ×3 IMPLANT
LEGGING LITHOTOMY PAIR STRL (DRAPES) ×2 IMPLANT
LIGASURE IMPACT 36 18CM CVD LR (INSTRUMENTS) IMPLANT
NS IRRIG 1000ML POUR BTL (IV SOLUTION) ×6 IMPLANT
PACK GENERAL/GYN (CUSTOM PROCEDURE TRAY) ×3 IMPLANT
PACK SURGICAL SETUP 50X90 (CUSTOM PROCEDURE TRAY) ×2 IMPLANT
PAD ARMBOARD 7.5X6 YLW CONV (MISCELLANEOUS) ×6 IMPLANT
SPECIMEN JAR X LARGE (MISCELLANEOUS) IMPLANT
SPONGE GAUZE 4X4 12PLY (GAUZE/BANDAGES/DRESSINGS) ×3 IMPLANT
SPONGE LAP 18X18 X RAY DECT (DISPOSABLE) ×4 IMPLANT
STAPLER CIRC CVD 29MM 37CM (STAPLE) ×2 IMPLANT
STAPLER VISISTAT 35W (STAPLE) ×3 IMPLANT
SUCTION POOLE TIP (SUCTIONS) IMPLANT
SUT PDS AB 1 CTX 36 (SUTURE) IMPLANT
SUT PDS AB 1 TP1 54 (SUTURE) ×4 IMPLANT
SUT PDS AB 1 TP1 96 (SUTURE) ×4 IMPLANT
SUT PROLENE 2 0 KS (SUTURE) ×2 IMPLANT
SUT SILK 2 0 SH CR/8 (SUTURE) ×3 IMPLANT
SUT SILK 2 0 TIES 10X30 (SUTURE) ×3 IMPLANT
SUT SILK 3 0 SH CR/8 (SUTURE) ×6 IMPLANT
SUT SILK 3 0 TIES 10X30 (SUTURE) ×3 IMPLANT
SUT VIC AB 3-0 SH 18 (SUTURE) IMPLANT
TAPE CLOTH SURG 4X10 WHT LF (GAUZE/BANDAGES/DRESSINGS) ×2 IMPLANT
TOWEL OR 17X24 6PK STRL BLUE (TOWEL DISPOSABLE) ×3 IMPLANT
TOWEL OR 17X26 10 PK STRL BLUE (TOWEL DISPOSABLE) ×3 IMPLANT
TRAY FOLEY CATH 14FRSI W/METER (CATHETERS) ×3 IMPLANT
TRAY PROCTOSCOPIC FIBER OPTIC (SET/KITS/TRAYS/PACK) ×2 IMPLANT
UNDERPAD 30X30 INCONTINENT (UNDERPADS AND DIAPERS) IMPLANT
WATER STERILE IRR 1000ML POUR (IV SOLUTION) IMPLANT
YANKAUER SUCT BULB TIP NO VENT (SUCTIONS) ×3 IMPLANT

## 2011-12-20 NOTE — Preoperative (Signed)
Beta Blockers   Reason not to administer Beta Blockers:Not Applicable 
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
## 2011-12-20 NOTE — Anesthesia Postprocedure Evaluation (Signed)
  Anesthesia Post-op Note  Patient: Phillip Mcgee  Procedure(s) Performed: Procedure(s) (LRB): COLOSTOMY CLOSURE (N/A)  Patient Location: PACU  Anesthesia Type: General  Level of Consciousness: awake, alert  and oriented  Airway and Oxygen Therapy: Patient Spontanous Breathing and Patient connected to nasal cannula oxygen  Post-op Pain: mild  Post-op Assessment: Post-op Vital signs reviewed and Patient's Cardiovascular Status Stable  Post-op Vital Signs: stable  Complications: No apparent anesthesia complications
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
## 2011-12-20 NOTE — Anesthesia Procedure Notes (Signed)
Procedure Name: Intubation Date/Time: 12/20/2011 7:40 AM Performed by: Bronson Ing Pre-anesthesia Checklist: Emergency Drugs available, Timeout performed, Patient identified, Suction available and Patient being monitored Patient Re-evaluated:Patient Re-evaluated prior to inductionOxygen Delivery Method: Circle system utilized Preoxygenation: Pre-oxygenation with 100% oxygen Intubation Type: IV induction Ventilation: Mask ventilation without difficulty Laryngoscope Size: Mac and 3 Grade View: Grade II Tube type: Oral Tube size: 7.5 mm Number of attempts: 1 Placement Confirmation: ETT inserted through vocal cords under direct vision,  breath sounds checked- equal and bilateral and positive ETCO2 Secured at: 23 cm Tube secured with: Tape Dental Injury: Teeth and Oropharynx as per pre-operative assessment

## 2011-12-20 NOTE — Transfer of Care (Signed)
Immediate Anesthesia Transfer of Care Note  Patient: Phillip Mcgee  Procedure(s) Performed: Procedure(s) (LRB): COLOSTOMY CLOSURE (N/A)  Patient Location: PACU  Anesthesia Type: General  Level of Consciousness: awake, alert  and oriented  Airway & Oxygen Therapy: Patient Spontanous Breathing and Patient connected to nasal cannula oxygen  Post-op Assessment: Report given to PACU RN, Post -op Vital signs reviewed and stable and Patient moving all extremities X 4  Post vital signs: Reviewed and stable  Complications: No apparent anesthesia complications
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
## 2011-12-21 ENCOUNTER — Encounter (HOSPITAL_COMMUNITY): Payer: Self-pay | Admitting: General Surgery

## 2011-12-21 DIAGNOSIS — D72829 Elevated white blood cell count, unspecified: Secondary | ICD-10-CM

## 2011-12-21 DIAGNOSIS — IMO0002 Reserved for concepts with insufficient information to code with codable children: Secondary | ICD-10-CM

## 2011-12-21 LAB — BASIC METABOLIC PANEL
BUN: 5 mg/dL — ABNORMAL LOW (ref 6–23)
CO2: 27 mEq/L (ref 19–32)
Chloride: 99 mEq/L (ref 96–112)
Creatinine, Ser: 1.02 mg/dL (ref 0.50–1.35)
GFR calc Af Amer: >90 mL/min (ref >90)
Potassium: 4.1 mEq/L (ref 3.5–5.1)

## 2011-12-21 LAB — CBC
HCT: 43 % (ref 39.0–52.0)
Hemoglobin: 14.6 g/dL (ref 13.0–17.0)
MCV: 85.8 fL (ref 78.0–100.0)
WBC: 13.6 10*3/uL — ABNORMAL HIGH (ref 4.0–10.5)

## 2011-12-21 MED ORDER — HYDROMORPHONE 0.3 MG/ML IV SOLN
INTRAVENOUS | Status: AC
  Filled 2011-12-21: qty 25

## 2011-12-21 MED ORDER — HYDROMORPHONE 0.3 MG/ML IV SOLN
INTRAVENOUS | Status: AC
  Administered 2011-12-21: 04:00:00
  Filled 2011-12-21: qty 25

## 2011-12-21 MED ORDER — PANTOPRAZOLE SODIUM 40 MG IV SOLR
40.0000 mg | INTRAVENOUS | Status: DC
  Administered 2011-12-21 – 2011-12-24 (×4): 40 mg via INTRAVENOUS
  Filled 2011-12-21 (×5): qty 40

## 2011-12-21 MED ORDER — METHOCARBAMOL 100 MG/ML IJ SOLN
1000.0000 mg | Freq: Four times a day (QID) | INTRAVENOUS | Status: DC | PRN
  Administered 2011-12-21 – 2011-12-23 (×3): 1000 mg via INTRAVENOUS
  Filled 2011-12-21 (×4): qty 10

## 2011-12-21 NOTE — Progress Notes (Signed)
UR of chart complete.  

## 2011-12-21 NOTE — Progress Notes (Signed)
Having MM spasms so will add Robaxin I spoke with the patient's mother as well at the bedside Patient examined and I agree with the assessment and plan  Violeta Gelinas, MD, MPH, FACS Pager: [phone]  12/21/2011 10:45 AM

## 2011-12-21 NOTE — Progress Notes (Signed)
Patient ID: Phillip Mcgee, male   DOB: 05/06/1986, 26 y.o.   MRN: [id number]   LOS: 1 day  POD#1  Subjective: Pain adequately controlled. No N/V. No flatus.  Objective: Vital signs in last 24 hours: Temp:  [97.5 F (36.4 C)-99.1 F (37.3 C)] 98.2 F (36.8 C) (04/16 0621) Pulse Rate:  [71-94] 80  (04/16 0621) Resp:  [12-23] 14  (04/16 0621) BP: (123-156)/(67-92) 125/67 mmHg (04/16 0621) SpO2:  [95 %-100 %] 98 % (04/16 0621) Weight:  [86.8 kg (191 lb 5.8 oz)] 86.8 kg (191 lb 5.8 oz) (04/15 1323) Last BM Date: 12/20/11  Lab Results:  CBC  Basename 12/21/11 0535  WBC 13.6*  HGB 14.6  HCT 43.0  PLT 277   BMET  Basename 12/21/11 0535  NA 136  K 4.1  CL 99  CO2 27  GLUCOSE 112*  BUN 5*  CREATININE 1.02  CALCIUM 9.2    General appearance: alert and no distress Resp: clear to auscultation bilaterally Cardio: regular rate and rhythm GI: Soft, appropriately TTP, No BS. Incision intact, wicks in place. Dressing moderately saturated with serosanguinous fluid, changed.  Assessment/Plan: S/p colostomy takedown -- Expected postoperative ileus. Continue supportive care. Check CBC tomorrow with slightly elevated WBC. FEN -- NPO VTE -- Lovenox Dispo -- Ileus   Freeman Caldron, PA-C Pager: [phone] General Trauma PA Pager: [phone]   12/21/2011

## 2011-12-22 LAB — CBC
HCT: 39.7 % (ref 39.0–52.0)
Hemoglobin: 13.5 g/dL (ref 13.0–17.0)
MCH: 28.7 pg (ref 26.0–34.0)
MCHC: 34 g/dL (ref 30.0–36.0)
MCV: 84.5 fL (ref 78.0–100.0)
RBC: 4.7 MIL/uL (ref 4.22–5.81)

## 2011-12-22 MED ORDER — CHLORHEXIDINE GLUCONATE 0.12 % MT SOLN
15.0000 mL | Freq: Two times a day (BID) | OROMUCOSAL | Status: DC
  Administered 2011-12-22 – 2011-12-25 (×7): 15 mL via OROMUCOSAL
  Filled 2011-12-22 (×7): qty 15

## 2011-12-22 MED ORDER — BIOTENE DRY MOUTH MT LIQD
15.0000 mL | Freq: Two times a day (BID) | OROMUCOSAL | Status: DC
  Administered 2011-12-22 – 2011-12-25 (×7): 15 mL via OROMUCOSAL

## 2011-12-22 NOTE — Progress Notes (Signed)
2 Days Post-Op  Subjective: No flatus, better with robaxin  Objective: Vital signs in last 24 hours: Temp:  [98.2 F (36.8 C)-99.2 F (37.3 C)] 98.7 F (37.1 C) (04/17 0611) Pulse Rate:  [89-110] 110  (04/17 0611) Resp:  [12-20] 19  (04/17 0611) BP: (116-135)/(71-86) 134/82 mmHg (04/17 0611) SpO2:  [94 %-100 %] 98 % (04/17 0611) Last BM Date: 12/21/11  Intake/Output from previous day: 04/16 0701 - 04/17 0700 In: 2623.1 [I.V.:2623.1] Out: 590 [Urine:590] Intake/Output this shift:    General appearance: alert, cooperative and no distress Resp: clear to auscultation bilaterally Cardio: regular rate and rhythm GI: ooze from lower wicks stopped with brief pressure, wound otherwise OK, no significant BS, soft, only expected tenderness  Lab Results:   [facility] 12/22/11 0648 12/21/11 0535  WBC 10.4 13.6*  HGB 13.5 14.6  HCT 39.7 43.0  PLT 283 277   BMET  Basename 12/21/11 0535  NA 136  K 4.1  CL 99  CO2 27  GLUCOSE 112*  BUN 5*  CREATININE 1.02  CALCIUM 9.2   PT/INR No results found for this basename: LABPROT:2,INR:2 in the last 72 hours ABG No results found for this basename: PHART:2,PCO2:2,PO2:2,HCO3:2 in the last 72 hours  Studies/Results: No results found.  Anti-infectives: Anti-infectives     Start     Dose/Rate Route Frequency Ordered Stop   12/20/11 0652   ertapenem [facility]) 1 g in sodium chloride 0.9 % 50 mL IVPB  Status:  Discontinued        1 g 100 mL/hr over 30 Minutes Intravenous 60 min pre-op 12/20/11 0652 12/20/11 1224   12/19/11 0908   ertapenem (INVANZ) 1 g in sodium chloride 0.9 % 50 mL IVPB        1 g 100 mL/hr over 30 Minutes Intravenous 60 min pre-op 12/19/11 0908 12/20/11 0737          Assessment/Plan: s/p Procedure(s) (LRB): COLOSTOMY CLOSURE (N/A) S/P colostomy takedown POO#2 Ileus - Entereg, ice chips and sips VTE - lovenox Ambulate F/U labs AM    Alice Burnside E 12/22/2011

## 2011-12-23 LAB — CBC
HCT: 37.7 % — ABNORMAL LOW (ref 39.0–52.0)
MCH: 28.8 pg (ref 26.0–34.0)
MCHC: 34 g/dL (ref 30.0–36.0)
MCV: 84.7 fL (ref 78.0–100.0)
Platelets: 253 10*3/uL (ref 150–400)
RDW: 13.7 % (ref 11.5–15.5)

## 2011-12-23 LAB — BASIC METABOLIC PANEL
BUN: 5 mg/dL — ABNORMAL LOW (ref 6–23)
Calcium: 9.4 mg/dL (ref 8.4–10.5)
Chloride: 101 mEq/L (ref 96–112)
Creatinine, Ser: 1.08 mg/dL (ref 0.50–1.35)
GFR calc Af Amer: >90 mL/min (ref >90)
GFR calc non Af Amer: >90 mL/min (ref >90)

## 2011-12-23 MED ORDER — HYDROMORPHONE 0.3 MG/ML IV SOLN
INTRAVENOUS | Status: AC
  Administered 2011-12-23: 0.3 mg via INTRAVENOUS
  Filled 2011-12-23: qty 25

## 2011-12-23 NOTE — Progress Notes (Signed)
The patient is with limited bowel sounds.  Will go slowly on feeding for now.  This patient has been seen and I agree with the findings and treatment plan.  Marta Lamas. Gae Bon, MD, FACS [phone] (pager) [phone] (direct pager) Trauma Surgeon

## 2011-12-23 NOTE — Progress Notes (Addendum)
Patient ID: Phillip Mcgee, male   DOB: [birth year], 26 y.o.   MRN: [id number]

## 2011-12-23 NOTE — Progress Notes (Signed)
Notified Dr. Janee Morn of patient passing small bloody stool. Received order for CBC in am and hold tomorrow's dose of Lovenox.

## 2011-12-23 NOTE — Progress Notes (Signed)
Patient ID: CLAVIN RUHLMAN, male   DOB: 02/10/1986, 26 y.o.   MRN: [id number]   LOS: 3 days  POD#3  Subjective: No change. Denies flatus.  Objective: Vital signs in last 24 hours: Temp:  [98.4 F (36.9 C)-98.8 F (37.1 C)] 98.6 F (37 C) (04/18 0627) Pulse Rate:  [85-105] 85  (04/18 0627) Resp:  [16-22] 16  (04/18 0842) BP: (116-144)/(73-95) 142/85 mmHg (04/18 0627) SpO2:  [97 %-100 %] 98 % (04/18 0842) Last BM Date: 12/19/11  Lab Results:  CBC  Basename 12/23/11 0600 12/22/11 0648  WBC 8.6 10.4  HGB 12.8* 13.5  HCT 37.7* 39.7  PLT 253 283   BMET  Basename 12/23/11 0600 12/21/11 0535  NA 137 136  K 3.8 4.1  CL 101 99  CO2 25 27  GLUCOSE 117* 112*  BUN 5* 5*  CREATININE 1.08 1.02  CALCIUM 9.4 9.2    General appearance: alert and no distress Resp: clear to auscultation bilaterally Cardio: regular rate and rhythm GI: Soft, diminished BS. Incisions clean, wicks in place.  Assessment/Plan: S/p colostomy takedown Ileus -- Continue sips/chips FEN -- No changes VTE -- Lovenox Dispo -- Ileus   Freeman Caldron, PA-C Pager: [phone] General Trauma PA Pager: [phone]   12/23/2011

## 2011-12-24 LAB — CBC
HCT: 37.6 % — ABNORMAL LOW (ref 39.0–52.0)
MCHC: 34 g/dL (ref 30.0–36.0)
Platelets: 266 10*3/uL (ref 150–400)
RDW: 13.3 % (ref 11.5–15.5)

## 2011-12-24 MED ORDER — OXYCODONE-ACETAMINOPHEN 5-325 MG PO TABS
1.0000 | ORAL_TABLET | ORAL | Status: DC | PRN
  Administered 2011-12-25: 1 via ORAL
  Filled 2011-12-24: qty 1

## 2011-12-24 MED ORDER — METHOCARBAMOL 500 MG PO TABS
1000.0000 mg | ORAL_TABLET | Freq: Four times a day (QID) | ORAL | Status: DC | PRN
  Administered 2011-12-25: 1000 mg via ORAL
  Filled 2011-12-24: qty 2

## 2011-12-24 NOTE — Progress Notes (Signed)
UR complete 

## 2011-12-24 NOTE — Progress Notes (Signed)
4 Days Post-Op  Subjective: +flatus, small BM overnight with some blood, no sig abdominal pain  Objective: Vital signs in last 24 hours: Temp:  [98.2 F (36.8 C)-99.3 F (37.4 C)] 98.2 F (36.8 C) (04/19 0527) Pulse Rate:  [77-89] 89  (04/19 0527) Resp:  [15-25] 17  (04/19 0527) BP: (105-132)/(66-93) 105/66 mmHg (04/19 0527) SpO2:  [97 %-100 %] 98 % (04/19 0527) Last BM Date: 12/19/11  Intake/Output from previous day: 04/18 0701 - 04/19 0700 In: 2933.5 [I.V.:2933.5] Out: 1800 [Urine:1800] Intake/Output this shift:    General appearance: alert, cooperative and no distress Resp: clear to auscultation bilaterally Cardio: regular rate and rhythm GI: incision only minimal serosang drainage lower midline, +BS, no significant tenderness  Lab Results:   Basename 12/23/11 0600 12/22/11 0648  WBC 8.6 10.4  HGB 12.8* 13.5  HCT 37.7* 39.7  PLT 253 283   BMET  Basename 12/23/11 0600  NA 137  K 3.8  CL 101  CO2 25  GLUCOSE 117*  BUN 5*  CREATININE 1.08  CALCIUM 9.4   PT/INR No results found for this basename: LABPROT:2,INR:2 in the last 72 hours ABG No results found for this basename: PHART:2,PCO2:2,PO2:2,HCO3:2 in the last 72 hours  Studies/Results: No results found.  Anti-infectives: Anti-infectives     Start     Dose/Rate Route Frequency Ordered Stop   12/20/11 0652   ertapenem (INVANZ) 1 g in sodium chloride 0.9 % 50 mL IVPB  Status:  Discontinued        1 g 100 mL/hr over 30 Minutes Intravenous 60 min pre-op 12/20/11 0652 12/20/11 1224   12/19/11 0908   ertapenem (INVANZ) 1 g in sodium chloride 0.9 % 50 mL IVPB        1 g 100 mL/hr over 30 Minutes Intravenous 60 min pre-op 12/19/11 0908 12/20/11 0737          Assessment/Plan: s/p Procedure(s) (LRB): COLOSTOMY CLOSURE (N/A) S/p colostomy takedown POD #4 Ileus -- on Entereg, resolving, clears this AM and advance later today if tolerates VTE -- Lovenox held as small blood with BM, PAS Change to oral  pain meds and D/C PCA Dispo -- Ileus  LOS: 4 days    Kewanna Kasprzak E 12/24/2011

## 2011-12-25 MED ORDER — METHOCARBAMOL 500 MG PO TABS: 1000.0000 mg | ORAL_TABLET | Freq: Four times a day (QID) | ORAL | Status: AC | PRN

## 2011-12-25 MED ORDER — OXYCODONE-ACETAMINOPHEN 5-325 MG PO TABS: 1.0000 | ORAL_TABLET | ORAL | Status: DC | PRN
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
## 2011-12-25 NOTE — Progress Notes (Signed)
Patient ID: Phillip Mcgee, male   DOB: 03-27-1986, 26 y.o.   MRN: [id number]   LOS: 5 days   Subjective: No N/V. +flatus.  Objective: Vital signs in last 24 hours: Temp:  [98.2 F (36.8 C)-98.8 F (37.1 C)] 98.2 F (36.8 C) (04/20 0618) Pulse Rate:  [78-85] 85  (04/20 0618) Resp:  [16-18] 18  (04/20 0618) BP: (112-123)/(69-74) 112/69 mmHg (04/20 0618) SpO2:  [96 %-100 %] 96 % (04/20 0618) Last BM Date: 12/25/11   General appearance: alert and no distress Resp: clear to auscultation bilaterally Cardio: regular rate and rhythm GI: Soft, +BS. Incision C/D/I.  Assessment/Plan: S/p colostomy takedown  Ileus -- Regular diet for lunch Dispo -- Home if tolerates regular diet.    Freeman Caldron, PA-C Pager: [phone] General Trauma PA Pager: [phone]   12/25/2011

## 2011-12-25 NOTE — Discharge Instructions (Signed)
Wash wound daily in shower with soap and water. Do not soak. Apply antibiotic ointment (eg neosporin) twice daily and as needed to keep moist.  No lifting more than 5 pounds until 01/31/12.  No driving while on oxycodone.

## 2011-12-26 NOTE — Discharge Summary (Signed)
This patient has been seen and I agree with the findings and treatment plan.  Corianna Avallone O. Karina Nofsinger, III, MD, FACS [phone] (pager) [phone] (direct pager) Trauma Surgeon  

## 2011-12-26 NOTE — Progress Notes (Signed)
This patient has been seen and I agree with the findings and treatment plan.  Kateline Kinkade O. Harman Ferrin, III, MD, FACS [phone] (pager) [phone] (direct pager) Trauma Surgeon  

## 2011-12-30 ENCOUNTER — Encounter (INDEPENDENT_AMBULATORY_CARE_PROVIDER_SITE_OTHER): Payer: Self-pay

## 2011-12-30 ENCOUNTER — Ambulatory Visit (INDEPENDENT_AMBULATORY_CARE_PROVIDER_SITE_OTHER): Payer: No Typology Code available for payment source | Admitting: Physician Assistant

## 2011-12-30 VITALS — Ht 70.0 in | Wt 183.8 lb

## 2011-12-30 DIAGNOSIS — Z980 Intestinal bypass and anastomosis status: Secondary | ICD-10-CM

## 2011-12-30 MED ORDER — OXYCODONE-ACETAMINOPHEN 5-325 MG PO TABS
1.0000 | ORAL_TABLET | ORAL | Status: AC | PRN
Start: 2011-12-30 — End: 2012-01-09

## 2011-12-30 NOTE — Progress Notes (Signed)
Patient ID: Phillip Mcgee, male   DOB: 15-Dec-1985, 26 y.o.   MRN: [id number]  Phillip Mcgee is seen in follow up after colostomy take down. He has been doing well.  He is tolerating a regular diet and having BM's. He is still taking some pain medication, but more so at night. He is increasing his activities.   PE: Gen- Wn, WD, in NAD ABD +BS, soft, midline and left colostomy site wounds are healing well with scant bloody drainage. Staples were removed without difficulty and the wound was steri-striped   Impression; S/P colostomy takedown, doing well  Plan- Increase activity, No lifting x 6 more weeks. follow up with trauma prn

## 2011-12-30 NOTE — Patient Instructions (Signed)
No lifting more than 5 lbs for 6 more weeks. Gradually increase activities as tolerated otherwise.  follow up with Trauma Service as needed [phone]

## 2012-01-12 ENCOUNTER — Emergency Department (HOSPITAL_COMMUNITY)
Admission: EM | Admit: 2012-01-12 | Discharge: 2012-01-12 | Disposition: A | Discharge status: Home / Self Care | Payer: Self-pay | Attending: Emergency Medicine | Admitting: Emergency Medicine

## 2012-01-12 ENCOUNTER — Encounter (HOSPITAL_COMMUNITY): Payer: Self-pay | Admitting: Emergency Medicine

## 2012-01-12 ENCOUNTER — Emergency Department (HOSPITAL_COMMUNITY): Payer: Self-pay

## 2012-01-12 DIAGNOSIS — K219 Gastro-esophageal reflux disease without esophagitis: Secondary | ICD-10-CM | POA: Insufficient documentation

## 2012-01-12 DIAGNOSIS — J45909 Unspecified asthma, uncomplicated: Secondary | ICD-10-CM | POA: Insufficient documentation

## 2012-01-12 DIAGNOSIS — T8131XA Disruption of external operation (surgical) wound, not elsewhere classified, initial encounter: Secondary | ICD-10-CM | POA: Insufficient documentation

## 2012-01-12 DIAGNOSIS — T8130XA Disruption of wound, unspecified, initial encounter: Secondary | ICD-10-CM

## 2012-01-12 DIAGNOSIS — F17200 Nicotine dependence, unspecified, uncomplicated: Secondary | ICD-10-CM | POA: Insufficient documentation

## 2012-01-12 DIAGNOSIS — Y838 Other surgical procedures as the cause of abnormal reaction of the patient, or of later complication, without mention of misadventure at the time of the procedure: Secondary | ICD-10-CM | POA: Insufficient documentation

## 2012-01-12 DIAGNOSIS — Z933 Colostomy status: Secondary | ICD-10-CM | POA: Insufficient documentation

## 2012-01-12 LAB — CBC
MCH: 29.5 pg (ref 26.0–34.0)
MCHC: 34.8 g/dL (ref 30.0–36.0)
Platelets: 344 10*3/uL (ref 150–400)
RDW: 14.3 % (ref 11.5–15.5)

## 2012-01-12 LAB — COMPREHENSIVE METABOLIC PANEL
AST: 41 U/L — ABNORMAL HIGH (ref 0–37)
Albumin: 3.7 g/dL (ref 3.5–5.2)
Alkaline Phosphatase: 49 U/L (ref 39–117)
BUN: 8 mg/dL (ref 6–23)
Chloride: 103 mEq/L (ref 96–112)
Potassium: 4.5 mEq/L (ref 3.5–5.1)
Total Bilirubin: 0.2 mg/dL — ABNORMAL LOW (ref 0.3–1.2)
Total Protein: 7 g/dL (ref 6.0–8.3)

## 2012-01-12 LAB — DIFFERENTIAL
Basophils Absolute: 0 10*3/uL (ref 0.0–0.1)
Basophils Relative: 0 % (ref 0–1)
Eosinophils Absolute: 0.1 10*3/uL (ref 0.0–0.7)
Neutro Abs: 5.1 10*3/uL (ref 1.7–7.7)
Neutrophils Relative %: 61 % (ref 43–77)

## 2012-01-12 MED ORDER — IOHEXOL 300 MG/ML  SOLN
100.0000 mL | Freq: Once | INTRAMUSCULAR | Status: AC | PRN
  Administered 2012-01-12: 100 mL via INTRAVENOUS

## 2012-01-12 MED ORDER — MORPHINE SULFATE 4 MG/ML IJ SOLN
4.0000 mg | Freq: Once | INTRAMUSCULAR | Status: AC
  Administered 2012-01-12: 4 mg via INTRAVENOUS
  Filled 2012-01-12: qty 1
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
## 2012-01-12 NOTE — ED Notes (Signed)
Pt had surgery on April 15th with staple removal on April 25th. Family reports that one area healed but another area is getting bigger and has odor to it.
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
## 2012-01-12 NOTE — Discharge Instructions (Signed)
You surgical incision does not appear to be infected. There is mild dehiscence (wound separation) of the skin. The layers beneath the skin are closed. You CT scan does not show anything concerning. You need to continue to gently clean this area with mild soap and warm water daily and then pat dry. You neWound Dehiscence Wound dehiscence is when a surgical cut (incision) opens up. It usually happens 7 to 10 days after surgery. You may have pain, a fever, or have more fluid coming from the cut. It should be treated early. HOME CARE  Only take medicines as told by your doctor.   Take your medicines (antibiotics) as told. Finish them even if you start to feel better.   Wash your wound with warm, soapy water 2 times a day, or as told. Pat the wound dry. Do not rub the wound.   Change bandages (dressings) as often as told. Wash your hands before and after changing bandages. Apply bandages as told.   Take showers. Do not soak the wound, bathe, or swim until your wound is healed.   Avoid exercises that make you sweat.   Use medicines that stop itching as told by your doctor. The wound may itch as it heals. Do not pick or scratch at the wound.   Do not lift more than 10 pounds (4.5 kilograms) until the wound is healed, or as told by your doctor.   Keep all doctor visits as told.  GET HELP RIGHT AWAY IF:   You have more puffiness (swelling) or redness around the wound.   You have more pain in the wound.   You have yellowish white fluid (pus) coming from the wound.   More of the wound breaks open.   You have a fever.  MAKE SURE YOU:   Understand these instructions.   Will watch your condition.   Will get help right away if you are not doing well or get worse.  Document Released: 08/11/2009 Document Revised: 08/12/2011 Document Reviewed: 12/27/2010 [employer] Patient Information 2012 [employer].ed to follow-up in the trauma clinic. Return for fever, chills, increasing redness around  the site, drainage that looks like pus or anything else concerning to you.

## 2012-01-12 NOTE — ED Provider Notes (Signed)
Medical screening examination/treatment/procedure(s) were conducted as a shared visit with non-physician practitioner(s) and myself.  I personally evaluated the patient during the encounter.  Please see completed note for this encounter.  Raeford Razor, MD 01/12/12 [phone]

## 2012-01-12 NOTE — ED Notes (Signed)
MD at bedside. 
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
## 2017-11-05 ENCOUNTER — Emergency Department (HOSPITAL_COMMUNITY): Payer: Self-pay

## 2017-11-05 ENCOUNTER — Encounter (HOSPITAL_COMMUNITY): Payer: Self-pay | Admitting: *Deleted

## 2017-11-05 ENCOUNTER — Emergency Department (HOSPITAL_COMMUNITY)
Admission: EM | Admit: 2017-11-05 | Discharge: 2017-11-05 | Disposition: A | Discharge status: Home / Self Care | Payer: Self-pay | Attending: Emergency Medicine | Admitting: Emergency Medicine

## 2017-11-05 DIAGNOSIS — B349 Viral infection, unspecified: Secondary | ICD-10-CM | POA: Insufficient documentation

## 2017-11-05 DIAGNOSIS — F17210 Nicotine dependence, cigarettes, uncomplicated: Secondary | ICD-10-CM | POA: Insufficient documentation

## 2017-11-05 DIAGNOSIS — Z8709 Personal history of other diseases of the respiratory system: Secondary | ICD-10-CM | POA: Insufficient documentation

## 2017-11-05 MED ORDER — BENZONATATE 100 MG PO CAPS: 100.0000 mg | ORAL_CAPSULE | Freq: Three times a day (TID) | ORAL | 0 refills | Status: DC | PRN

## 2017-11-05 MED ORDER — FLUTICASONE PROPIONATE 50 MCG/ACT NA SUSP: 1.0000 | Freq: Every day | NASAL | 2 refills | Status: DC

## 2017-11-05 MED ORDER — IBUPROFEN 800 MG PO TABS: 800.0000 mg | ORAL_TABLET | Freq: Three times a day (TID) | ORAL | 0 refills | Status: DC | PRN

## 2017-11-05 NOTE — ED Notes (Signed)
Declined W/C at D/C and was escorted to lobby by RN. 

## 2017-11-05 NOTE — Discharge Instructions (Signed)
You were seen in the emergency today and diagnosed with a viral illness. Your x-ray did not show signs of pneumonia. I have prescribed you multiple medications to treat your symptoms.   -Flonase to be used 1 spray in each nostril daily.  This medication is used to treat your congestion.  -Tessalon can be taken once every 8 hours as needed.  This medication is used to treat your cough.  -Ibuprofen to be taken once every 8 hours as needed for pain.  You will need to follow-up with your primary care provider in 1 week if your symptoms have not improved.  If you do not have a primary care provider one is provided in your discharge instructions.  Return to the emergency department for any new or worsening symptoms including but not limited to persistent fever that does not improve with motrin/tylenol,  difficulty breathing, chest pain, or passing out, or any other concerns.

## 2017-11-05 NOTE — ED Triage Notes (Signed)
Pt c/o productive cough with green sputum onset x 2 wks with headache, pt denies fever & chills, pt c/o runny nose and sore throat, A&O x4
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
## 2018-02-27 ENCOUNTER — Emergency Department (HOSPITAL_COMMUNITY): Payer: Self-pay

## 2018-02-27 ENCOUNTER — Encounter (HOSPITAL_COMMUNITY): Payer: Self-pay | Admitting: Emergency Medicine

## 2018-02-27 ENCOUNTER — Emergency Department (HOSPITAL_COMMUNITY)
Admission: EM | Admit: 2018-02-27 | Discharge: 2018-02-27 | Disposition: A | Discharge status: Home / Self Care | Payer: Self-pay | Attending: Physician Assistant | Admitting: Physician Assistant

## 2018-02-27 DIAGNOSIS — Y999 Unspecified external cause status: Secondary | ICD-10-CM | POA: Insufficient documentation

## 2018-02-27 DIAGNOSIS — Z79899 Other long term (current) drug therapy: Secondary | ICD-10-CM | POA: Insufficient documentation

## 2018-02-27 DIAGNOSIS — Y939 Activity, unspecified: Secondary | ICD-10-CM | POA: Insufficient documentation

## 2018-02-27 DIAGNOSIS — S01512A Laceration without foreign body of oral cavity, initial encounter: Secondary | ICD-10-CM | POA: Insufficient documentation

## 2018-02-27 DIAGNOSIS — F17210 Nicotine dependence, cigarettes, uncomplicated: Secondary | ICD-10-CM | POA: Insufficient documentation

## 2018-02-27 DIAGNOSIS — Y929 Unspecified place or not applicable: Secondary | ICD-10-CM | POA: Insufficient documentation

## 2018-02-27 DIAGNOSIS — S9031XA Contusion of right foot, initial encounter: Secondary | ICD-10-CM | POA: Insufficient documentation

## 2018-02-27 DIAGNOSIS — J45909 Unspecified asthma, uncomplicated: Secondary | ICD-10-CM | POA: Insufficient documentation

## 2018-02-27 DIAGNOSIS — Z23 Encounter for immunization: Secondary | ICD-10-CM | POA: Insufficient documentation

## 2018-02-27 MED ORDER — TETANUS-DIPHTH-ACELL PERTUSSIS 5-2.5-18.5 LF-MCG/0.5 IM SUSP
0.5000 mL | Freq: Once | INTRAMUSCULAR | Status: AC
  Administered 2018-02-27: 0.5 mL via INTRAMUSCULAR
  Filled 2018-02-27: qty 0.5

## 2018-02-27 MED ORDER — MELOXICAM 15 MG PO TABS: 15.0000 mg | ORAL_TABLET | Freq: Every day | ORAL | 0 refills | Status: DC

## 2018-02-27 NOTE — Discharge Instructions (Addendum)
Contact a health care provider if: You were given a tetanus shot and have swelling, severe pain, redness, or bleeding at the injection site. You have a fever. Your pain is not controlled with medicine. You have redness, swelling, or pain at your wound that is getting worse. You have fresh bleeding or pus coming from your wound. The edges of your wound break open. You develop swollen, tender glands in your throat. Get help right away if: Your face or the area under your jaw becomes swollen. You have trouble breathing or swallowing. Get help right away if: You have severe pain. Your foot or toes become numb. Your foot or toes become pale or cold. You cannot move your foot or ankle. Your foot is warm to the touch.
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
## 2018-02-27 NOTE — ED Notes (Signed)
Ortho paged. 

## 2018-02-27 NOTE — ED Notes (Signed)
Got patient cluthes and post shoe

## 2018-02-27 NOTE — ED Notes (Signed)
PT returned from xray

## 2019-03-07 ENCOUNTER — Emergency Department (HOSPITAL_COMMUNITY): Payer: Self-pay

## 2019-03-07 ENCOUNTER — Emergency Department (HOSPITAL_COMMUNITY)
Admission: EM | Admit: 2019-03-07 | Discharge: 2019-03-07 | Disposition: A | Discharge status: Home / Self Care | Payer: Self-pay | Attending: Emergency Medicine | Admitting: Emergency Medicine

## 2019-03-07 ENCOUNTER — Other Ambulatory Visit: Payer: Self-pay

## 2019-03-07 DIAGNOSIS — F17210 Nicotine dependence, cigarettes, uncomplicated: Secondary | ICD-10-CM | POA: Insufficient documentation

## 2019-03-07 DIAGNOSIS — R059 Cough, unspecified: Secondary | ICD-10-CM

## 2019-03-07 DIAGNOSIS — J45909 Unspecified asthma, uncomplicated: Secondary | ICD-10-CM | POA: Insufficient documentation

## 2019-03-07 DIAGNOSIS — R05 Cough: Secondary | ICD-10-CM | POA: Insufficient documentation

## 2019-03-07 MED ORDER — ALBUTEROL SULFATE HFA 108 (90 BASE) MCG/ACT IN AERS
2.0000 | INHALATION_SPRAY | RESPIRATORY_TRACT | Status: DC | PRN
  Administered 2019-03-07: 23:00:00 2 via RESPIRATORY_TRACT
  Filled 2019-03-07: qty 6.7

## 2019-03-07 MED ORDER — BENZONATATE 100 MG PO CAPS: 100.0000 mg | ORAL_CAPSULE | Freq: Three times a day (TID) | ORAL | 0 refills | Status: DC

## 2019-03-07 MED ORDER — AZITHROMYCIN 250 MG PO TABS: 250.0000 mg | ORAL_TABLET | Freq: Every day | ORAL | 0 refills | Status: DC

## 2019-03-07 NOTE — Discharge Instructions (Signed)
Take medications as prescribed. Use inhaler for cough if this offers relief.   Follow up with your doctor if symptoms persist.   Return to the ED with any worsening symptoms or new concerns.   Continue isolation and social distancing as discussed.
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
## 2019-03-07 NOTE — ED Triage Notes (Signed)
Per pt he has been having a nagging cough and nasal drainage for about 3 weeks but progressed over the past week. No fevers no chills

## 2019-09-05 ENCOUNTER — Ambulatory Visit (HOSPITAL_COMMUNITY)
Admission: EM | Admit: 2019-09-05 | Discharge: 2019-09-05 | Disposition: A | Discharge status: Home / Self Care | Payer: Self-pay | Attending: Urgent Care | Admitting: Urgent Care

## 2019-09-05 ENCOUNTER — Encounter (HOSPITAL_COMMUNITY): Payer: Self-pay | Admitting: Emergency Medicine

## 2019-09-05 ENCOUNTER — Other Ambulatory Visit: Payer: Self-pay

## 2019-09-05 DIAGNOSIS — S76211A Strain of adductor muscle, fascia and tendon of right thigh, initial encounter: Secondary | ICD-10-CM

## 2019-09-05 DIAGNOSIS — R1031 Right lower quadrant pain: Secondary | ICD-10-CM

## 2019-09-05 MED ORDER — NAPROXEN 500 MG PO TABS: 500.0000 mg | ORAL_TABLET | Freq: Two times a day (BID) | ORAL | 0 refills | Status: DC

## 2019-09-05 MED ORDER — CYCLOBENZAPRINE HCL 10 MG PO TABS: 10.0000 mg | ORAL_TABLET | Freq: Every evening | ORAL | 0 refills | Status: DC | PRN

## 2019-09-05 NOTE — ED Triage Notes (Signed)
PT believes he pulled a muscle in right groin this morning. No testicular pain or swelling

## 2019-09-05 NOTE — ED Provider Notes (Signed)
Phillip Mcgee   MRN: [id number] DOB: 1985-10-02  Subjective:   Phillip Mcgee is a 33 y.o. male presenting for acute onset of moderate-severe stabbing pain of the right groin this morning when he got out of bed. Has not tried any medications for relief. 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 Denies any particular trauma, inciting events.   No current facility-administered medications for this encounter.  Current Outpatient Medications:  .  azithromycin (ZITHROMAX) 250 MG tablet, Take 1 tablet (250 mg total) by mouth daily. Take first 2 tablets together, then 1 every day until finished., Disp: 6 tablet, Rfl: 0 .  benzonatate (TESSALON) 100 MG capsule, Take 1 capsule (100 mg total) by mouth every 8 (eight) hours., Disp: 21 capsule, Rfl: 0 .  fluticasone (FLONASE) 50 MCG/ACT nasal spray, Place 1 spray into both nostrils daily., Disp: 16 g, Rfl: 2 .  ibuprofen (ADVIL,MOTRIN) 800 MG tablet, Take 1 tablet (800 mg total) by mouth every 8 (eight) hours as needed., Disp: 30 tablet, Rfl: 0 .  meloxicam (MOBIC) 15 MG tablet, Take 1 tablet (15 mg total) by mouth daily. Take 1 daily with food., Disp: 10 tablet, Rfl: 0 .  oxyCODONE-acetaminophen (PERCOCET) 5-325 MG per tablet, Take 1 tablet by mouth every 4 (four) hours as needed. For pain, Disp: , Rfl:    No Known Allergies  Past Medical History:  Diagnosis Date  . Asthma    as child  . GERD (gastroesophageal reflux disease)      Past Surgical History:  Procedure Laterality Date  . COLOSTOMY    . COLOSTOMY CLOSURE  12/20/2011   Procedure: COLOSTOMY CLOSURE;  Surgeon: Zenovia Jarred, MD;  Location: [facility] OR;  Service: General;  Laterality: N/A;    Family History  Problem Relation Age of Onset  . Hypertension Mother     Social History   Tobacco Use  . Smoking status: Current Every Day Smoker    Packs/day: 0.30    Years: 15.00    Pack years: 4.50    Types: Cigarettes  . Smokeless tobacco:  Never Used  Substance Use Topics  . Alcohol use: Yes    Alcohol/week: 3.0 standard drinks    Types: 3 Cans of beer per week  . Drug use: No    Frequency: 2.0 times per week    Types: Marijuana    Comment: Quit April 14,2013    Review of Systems  Constitutional: Negative for fever and malaise/fatigue.  HENT: Negative for congestion, ear pain, sinus pain and sore throat.   Eyes: Negative for discharge and redness.  Respiratory: Negative for cough, hemoptysis, shortness of breath and wheezing.   Cardiovascular: Negative for chest pain.  Gastrointestinal: Negative for abdominal pain, diarrhea, nausea and vomiting.  Genitourinary: Negative for dysuria, flank pain, frequency, hematuria and urgency.  Musculoskeletal: Negative for myalgias.  Skin: Negative for rash.  Neurological: Negative for dizziness, weakness and headaches.  Psychiatric/Behavioral: Negative for depression and substance abuse.   Denies dysuria, hematuria, urinary frequency, penile discharge, penile swelling, testicular pain, testicular swelling, anal pain, bloody stools, constipation, hx of hernias, hx of renal stones.  Objective:   Vitals: BP 118/79   Pulse 80   Temp 98.6 F (37 C)   Resp 16   SpO2 100%   Physical Exam Constitutional:      General: He is not in acute distress.    Appearance: Normal appearance. He is well-developed and normal weight. He is not  ill-appearing, toxic-appearing or diaphoretic.  HENT:     Head: Normocephalic and atraumatic.     Right Ear: External ear normal.     Left Ear: External ear normal.     Nose: Nose normal.     Mouth/Throat:     Pharynx: Oropharynx is clear.  Eyes:     General: No scleral icterus.       Right eye: No discharge.        Left eye: No discharge.     Extraocular Movements: Extraocular movements intact.     Pupils: Pupils are equal, round, and reactive to light.  Cardiovascular:     Rate and Rhythm: Normal rate and regular rhythm.     Heart sounds: No  murmur. No friction rub. No gallop.   Pulmonary:     Effort: Pulmonary effort is normal. No respiratory distress.     Breath sounds: No wheezing or rales.  Abdominal:     General: Bowel sounds are normal. There is no distension.     Palpations: Abdomen is soft. There is no mass.     Tenderness: There is no abdominal tenderness. There is no guarding or rebound.  Genitourinary:   Musculoskeletal:     Cervical back: Normal range of motion.  Skin:    General: Skin is warm and dry.  Neurological:     Mental Status: He is alert and oriented to person, place, and time.  Psychiatric:        Mood and Affect: Mood normal.        Behavior: Behavior normal.        Thought Content: Thought content normal.        Judgment: Judgment normal.      Assessment and Plan :   1. Strain of groin, right, initial encounter   2. Right groin pain     We will manage for right groin strain secondary to the nature of his work.  Counseled patient on possibility of groin hernia but none is palpable on exam.  If patient symptoms do not improve over the next 1 to 2 weeks with conservative management using NSAID, Flexeril, rest patient is to contact [facility] surgery for consult. Counseled patient on potential for adverse effects with medications prescribed/recommended today, ER and return-to-clinic precautions discussed, patient verbalized understanding.    Wallis Bamberg, [place] 09/05/19 1616

## 2019-09-05 NOTE — Discharge Instructions (Signed)
If your pain does not improve in 1-2 weeks, you must contact [facility] for a consult.

## 2019-10-25 ENCOUNTER — Encounter: Payer: Self-pay | Admitting: Emergency Medicine

## 2019-10-25 ENCOUNTER — Other Ambulatory Visit: Payer: Self-pay

## 2019-10-25 ENCOUNTER — Emergency Department
Admission: EM | Admit: 2019-10-25 | Discharge: 2019-10-25 | Disposition: A | Discharge status: Home / Self Care | Payer: Self-pay | Attending: Emergency Medicine | Admitting: Emergency Medicine

## 2019-10-25 ENCOUNTER — Encounter (HOSPITAL_COMMUNITY): Payer: Self-pay | Admitting: Emergency Medicine

## 2019-10-25 DIAGNOSIS — K920 Hematemesis: Secondary | ICD-10-CM | POA: Insufficient documentation

## 2019-10-25 DIAGNOSIS — F17200 Nicotine dependence, unspecified, uncomplicated: Secondary | ICD-10-CM | POA: Insufficient documentation

## 2019-10-25 LAB — CBC WITH DIFFERENTIAL/PLATELET
Abs Immature Granulocytes: 0.07 10*3/uL (ref 0.00–0.07)
Basophils Absolute: 0 10*3/uL (ref 0.0–0.1)
Basophils Relative: 0 %
Eosinophils Absolute: 0.1 10*3/uL (ref 0.0–0.5)
Eosinophils Relative: 2 %
HCT: 49.3 % (ref 39.0–52.0)
Hemoglobin: 16.4 g/dL (ref 13.0–17.0)
Immature Granulocytes: 1 %
Lymphocytes Relative: 36 %
Lymphs Abs: 2.8 10*3/uL (ref 0.7–4.0)
MCH: 29.2 pg (ref 26.0–34.0)
MCHC: 33.3 g/dL (ref 30.0–36.0)
MCV: 87.7 fL (ref 80.0–100.0)
Monocytes Absolute: 1 10*3/uL (ref 0.1–1.0)
Monocytes Relative: 13 %
Neutro Abs: 3.8 10*3/uL (ref 1.7–7.7)
Neutrophils Relative %: 48 %
Platelets: 371 10*3/uL (ref 150–400)
RBC: 5.62 MIL/uL (ref 4.22–5.81)
RDW: 13.9 % (ref 11.5–15.5)
WBC: 7.8 10*3/uL (ref 4.0–10.5)
nRBC: 0 % (ref 0.0–0.2)

## 2019-10-25 LAB — COMPREHENSIVE METABOLIC PANEL
ALT: 33 U/L (ref 0–44)
AST: 29 U/L (ref 15–41)
Albumin: 3.7 g/dL (ref 3.5–5.0)
Alkaline Phosphatase: 35 U/L — ABNORMAL LOW (ref 38–126)
Anion gap: 6 (ref 5–15)
BUN: 13 mg/dL (ref 6–20)
CO2: 27 mmol/L (ref 22–32)
Calcium: 8.9 mg/dL (ref 8.9–10.3)
Chloride: 107 mmol/L (ref 98–111)
Creatinine, Ser: 1.3 mg/dL — ABNORMAL HIGH (ref 0.61–1.24)
GFR calc Af Amer: >60 mL/min (ref >60)
GFR calc non Af Amer: >60 mL/min (ref >60)
Glucose, Bld: 104 mg/dL — ABNORMAL HIGH (ref 70–99)
Potassium: 4.2 mmol/L (ref 3.5–5.1)
Sodium: 140 mmol/L (ref 135–145)
Total Bilirubin: 0.7 mg/dL (ref 0.3–1.2)
Total Protein: 6.6 g/dL (ref 6.5–8.1)

## 2019-10-25 LAB — TYPE AND SCREEN
ABO/RH(D): A POS
Antibody Screen: NEGATIVE

## 2019-10-25 LAB — PROTIME-INR
INR: 1 (ref 0.8–1.2)
Prothrombin Time: 12.8 seconds (ref 11.4–15.2)

## 2019-10-25 LAB — MAGNESIUM: Magnesium: 1.9 mg/dL (ref 1.7–2.4)

## 2019-10-25 LAB — LIPASE, BLOOD: Lipase: 43 U/L (ref 11–51)

## 2019-10-25 MED ORDER — OMEPRAZOLE MAGNESIUM 20 MG PO TBEC: 20.0000 mg | DELAYED_RELEASE_TABLET | Freq: Every day | ORAL | 1 refills | Status: DC

## 2019-10-25 MED ORDER — ONDANSETRON 4 MG PO TBDP: ORAL_TABLET | ORAL | 0 refills | Status: DC

## 2019-10-25 MED ORDER — PANTOPRAZOLE SODIUM 40 MG IV SOLR
40.0000 mg | Freq: Once | INTRAVENOUS | Status: AC
  Administered 2019-10-25: 40 mg via INTRAVENOUS
  Filled 2019-10-25: qty 40

## 2019-10-25 MED ORDER — ONDANSETRON HCL 4 MG/2ML IJ SOLN
4.0000 mg | INTRAMUSCULAR | Status: AC
  Administered 2019-10-25: 4 mg via INTRAVENOUS
  Filled 2019-10-25: qty 2

## 2019-10-25 NOTE — ED Notes (Signed)
Pt in with co 1 episode of vomiting this am when he woke up. States he noted mod amount of dark color blood in emesis. No hx of the same, states he does drink at least 1-2 24oz of alcohol 4 times a day. Denies any abd pain, no dark color stools. Pt still co slight nausea, denies any other symptoms or recent illness.

## 2019-10-25 NOTE — ED Notes (Signed)
Pt states nausea has been relieved, no complaints at this time.

## 2019-10-25 NOTE — ED Notes (Signed)
Pt resting with eyes closed, awaiting disposition.
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
## 2019-10-25 NOTE — ED Triage Notes (Signed)
Patient ambulatory to triage with steady gait, without difficulty or distress noted, mask in place; pt reports V x 1 PTA with blood noted; denies abd pain

## 2019-12-05 ENCOUNTER — Other Ambulatory Visit: Payer: Self-pay

## 2019-12-05 ENCOUNTER — Ambulatory Visit (INDEPENDENT_AMBULATORY_CARE_PROVIDER_SITE_OTHER): Payer: Self-pay | Admitting: Gastroenterology

## 2019-12-05 ENCOUNTER — Encounter: Payer: Self-pay | Admitting: Gastroenterology

## 2019-12-05 VITALS — BP 113/76 | HR 88 | Temp 98.2°F | Ht 70.0 in | Wt 203.0 lb

## 2019-12-05 DIAGNOSIS — K226 Gastro-esophageal laceration-hemorrhage syndrome: Secondary | ICD-10-CM

## 2019-12-05 DIAGNOSIS — Z7289 Other problems related to lifestyle: Secondary | ICD-10-CM

## 2019-12-05 DIAGNOSIS — F1090 Alcohol use, unspecified, uncomplicated: Secondary | ICD-10-CM

## 2019-12-05 DIAGNOSIS — F17200 Nicotine dependence, unspecified, uncomplicated: Secondary | ICD-10-CM

## 2019-12-05 DIAGNOSIS — K920 Hematemesis: Secondary | ICD-10-CM
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
## 2019-12-06 ENCOUNTER — Encounter: Payer: Self-pay | Admitting: Gastroenterology

## 2019-12-06 LAB — CBC WITH DIFFERENTIAL/PLATELET
Basophils Absolute: 0 10*3/uL (ref 0.0–0.2)
Basos: 0 %
EOS (ABSOLUTE): 0.1 10*3/uL (ref 0.0–0.4)
Eos: 2 %
Hematocrit: 44.9 % (ref 37.5–51.0)
Hemoglobin: 15.1 g/dL (ref 13.0–17.7)
Immature Grans (Abs): 0 10*3/uL (ref 0.0–0.1)
Immature Granulocytes: 0 %
Lymphocytes Absolute: 1.6 10*3/uL (ref 0.7–3.1)
Lymphs: 29 %
MCH: 28.4 pg (ref 26.6–33.0)
MCHC: 33.6 g/dL (ref 31.5–35.7)
MCV: 84 fL (ref 79–97)
Monocytes Absolute: 0.7 10*3/uL (ref 0.1–0.9)
Monocytes: 13 %
Neutrophils Absolute: 3 10*3/uL (ref 1.4–7.0)
Neutrophils: 56 %
Platelets: 337 10*3/uL (ref 150–450)
RBC: 5.32 x10E6/uL (ref 4.14–5.80)
RDW: 13.1 % (ref 11.6–15.4)
WBC: 5.5 10*3/uL (ref 3.4–10.8)

## 2020-05-13 ENCOUNTER — Other Ambulatory Visit: Payer: Self-pay

## 2020-05-13 ENCOUNTER — Emergency Department: Payer: Self-pay

## 2020-05-13 ENCOUNTER — Emergency Department: Admission: EM | Admit: 2020-05-13 | Discharge: 2020-05-13 | Discharge status: Against Medical Advice | Payer: Self-pay

## 2020-05-13 ENCOUNTER — Encounter: Payer: Self-pay | Admitting: Emergency Medicine

## 2020-05-13 DIAGNOSIS — J45909 Unspecified asthma, uncomplicated: Secondary | ICD-10-CM | POA: Insufficient documentation

## 2020-05-13 DIAGNOSIS — Z20822 Contact with and (suspected) exposure to covid-19: Secondary | ICD-10-CM | POA: Insufficient documentation

## 2020-05-13 DIAGNOSIS — F17210 Nicotine dependence, cigarettes, uncomplicated: Secondary | ICD-10-CM | POA: Insufficient documentation

## 2020-05-13 DIAGNOSIS — B349 Viral infection, unspecified: Secondary | ICD-10-CM | POA: Insufficient documentation

## 2020-05-13 NOTE — ED Triage Notes (Signed)
Patient ambulatory to triage with steady gait, without difficulty or distress noted; pt reports having generalized HA & body aches with nonprod cough since this afternoon

## 2020-05-14 ENCOUNTER — Emergency Department
Admission: EM | Admit: 2020-05-14 | Discharge: 2020-05-14 | Disposition: A | Discharge status: Home / Self Care | Payer: Self-pay | Attending: Emergency Medicine | Admitting: Emergency Medicine

## 2020-05-14 DIAGNOSIS — B349 Viral infection, unspecified: Secondary | ICD-10-CM

## 2020-05-14 LAB — RESPIRATORY PANEL BY RT PCR (FLU A&B, COVID)
Influenza A by PCR: NEGATIVE
Influenza B by PCR: NEGATIVE
SARS Coronavirus 2 by RT PCR: NEGATIVE

## 2020-05-14 LAB — SARS CORONAVIRUS 2 BY RT PCR (HOSPITAL ORDER, PERFORMED IN ~~LOC~~ HOSPITAL LAB): SARS Coronavirus 2: NEGATIVE

## 2020-05-14 MED ORDER — BENZONATATE 100 MG PO CAPS: 100.0000 mg | ORAL_CAPSULE | Freq: Three times a day (TID) | ORAL | 0 refills | Status: AC | PRN

## 2020-05-14 MED ORDER — IBUPROFEN 600 MG PO TABS: 600.0000 mg | ORAL_TABLET | Freq: Four times a day (QID) | ORAL | 0 refills | Status: AC | PRN

## 2020-05-14 NOTE — ED Notes (Signed)
See triage note  Presents with body aches,subjective fever and cough  States sxs' started 1 day ago  Afebrile on arrival to ED
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
## 2020-07-18 ENCOUNTER — Ambulatory Visit (HOSPITAL_COMMUNITY)
Admission: EM | Admit: 2020-07-18 | Discharge: 2020-07-18 | Disposition: A | Discharge status: Other Acute Inpatient Hospital | Payer: No Payment, Other

## 2020-07-18 ENCOUNTER — Other Ambulatory Visit: Payer: Self-pay

## 2020-07-18 NOTE — ED Triage Notes (Addendum)
Patient states he has been having depression since age 34. Patient states he was being treated at [facility] in [place]. Patient states he has medications but has not taken any medications for 2 years. Patient denies SI/HI and A/V/H. Patient states he wants therapy and to get back on his medications. Patient states he was slapping himself last night. Patient has a history of cutting last cut a couple of years ago. Patient denies wanting to self harm at this time. Patient states he is seeking a therapist and medications.

## 2021-11-26 ENCOUNTER — Other Ambulatory Visit: Payer: Self-pay

## 2021-11-26 ENCOUNTER — Emergency Department
Admission: EM | Admit: 2021-11-26 | Discharge: 2021-11-26 | Disposition: A | Discharge status: Home / Self Care | Payer: 59 | Attending: Emergency Medicine | Admitting: Emergency Medicine

## 2021-11-26 ENCOUNTER — Emergency Department: Payer: 59

## 2021-11-26 DIAGNOSIS — S60022A Contusion of left index finger without damage to nail, initial encounter: Secondary | ICD-10-CM | POA: Insufficient documentation

## 2021-11-26 DIAGNOSIS — W231XXA Caught, crushed, jammed, or pinched between stationary objects, initial encounter: Secondary | ICD-10-CM | POA: Diagnosis not present

## 2021-11-26 DIAGNOSIS — S6992XA Unspecified injury of left wrist, hand and finger(s), initial encounter: Secondary | ICD-10-CM | POA: Diagnosis present
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
## 2021-11-26 NOTE — ED Provider Notes (Signed)
? ?  [facility] ?Provider Note ? ? ? Event Date/Time  ? First MD Initiated Contact with Patient 11/26/21 0234   ?  (approximate) ? ? ?History  ? ?Finger Injury ? ? ?HPI ? ?Phillip Mcgee is a 36 y.o. male who was on his way to work when he slammed his left index finger in the car door.  It is just the tip of the index finger.  It is very painful but he has good range of motion. ? ?  ? ? ?Physical Exam  ? ?Triage Vital Signs: ?ED Triage Vitals  ?Enc Vitals Group  ?   BP 11/26/21 0154 (!) 142/106  ?   Pulse Rate 11/26/21 0154 (!) 112  ?   Resp 11/26/21 0154 16  ?   Temp 11/26/21 0154 97.7 ?F (36.5 ?C)  ?   Temp Source 11/26/21 0154 Oral  ?   SpO2 11/26/21 0154 99 %  ?   Weight 11/26/21 0155 214 lb (97.1 kg)  ?   Height --   ?   Head Circumference --   ?   Peak Flow --   ?   Pain Score 11/26/21 0154 10  ?   Pain Loc --   ?   Pain Edu? --   ?   Excl. in GC? --   ? ? ?Most recent vital signs: ?Vitals:  ? 11/26/21 0154  ?BP: (!) 142/106  ?Pulse: (!) 112  ?Resp: 16  ?Temp: 97.7 ?F (36.5 ?C)  ?SpO2: 99%  ? ? ? ?General: Awake, no distress.  ?CV:  Good peripheral perfusion.  ?Resp:  Normal effort.  ?Abd:  No distention.  ?Index finger with good range of motion of all the joints.  There is some painful swelling of the distal phalanx.  There is no subungual hematoma.  The finger pad is not tense. ? ? ?ED Results / Procedures / Treatments  ? ?Labs ?(all labs ordered are listed, but only abnormal results are displayed) ?Labs Reviewed - No data to display ? ? ?EKG ? ? ? ? ?RADIOLOGY ?X-ray reviewed by me showed no fracture.  Radiologist reviewed the film afterwards and agrees. ? ? ?PROCEDURES: ? ?Critical Care performed:  ? ?Procedures ? ? ?MEDICATIONS ORDERED IN ED: ?Medications - No data to display ? ? ?IMPRESSION / MDM / ASSESSMENT AND PLAN / ED COURSE  ?I reviewed the triage vital signs and the nursing notes. ?Patient with apparent contusion of the finger.  I will give him a splint to protect the end of  the finger for couple days.  He can take Motrin for pain.  If is not better after couple days I will have him return to see his doctor is occasionally the bone in the hand he can break can not show up right away.  That would be unusual with this mechanism of injury but still possible. ? ?Patient denies any medicines injuries or medical problems.  Chart review shows he is allergic to hydrocodone with acetaminophen. ? ? ? ?FINAL CLINICAL IMPRESSION(S) / ED DIAGNOSES  ? ?Final diagnoses:  ?Contusion of left index finger without damage to nail, initial encounter  ? ? ? ?Rx / DC Orders  ? ?ED Discharge Orders   ? ? None  ? ?  ? ? ? ?Note:  This document was prepared using Dragon voice recognition software and may include unintentional dictation errors. ?  ?Arnaldo Natal, MD ?11/26/21 [phone] ? ?

## 2021-11-26 NOTE — Discharge Instructions (Signed)
Use Motrin for pain.  You can take 4 of the over-the-counter pills 3 times a day.  Wear the splint to Protect your finger.  After 2 - 3 days if your finger still very painful please return or see your doctor and will repeat the x-ray.  Occasionally something will fracture in the hand did not show up right away.  I do not think that is the case here however the splint will protect it 1 where the other. ?

## 2022-04-27 ENCOUNTER — Other Ambulatory Visit: Payer: Self-pay

## 2022-04-27 ENCOUNTER — Emergency Department
Admission: EM | Admit: 2022-04-27 | Discharge: 2022-04-27 | Disposition: A | Discharge status: Home / Self Care | Payer: 59 | Attending: Emergency Medicine | Admitting: Emergency Medicine

## 2022-04-27 ENCOUNTER — Emergency Department: Payer: 59

## 2022-04-27 DIAGNOSIS — R059 Cough, unspecified: Secondary | ICD-10-CM | POA: Diagnosis present

## 2022-04-27 DIAGNOSIS — Z20822 Contact with and (suspected) exposure to covid-19: Secondary | ICD-10-CM | POA: Diagnosis not present

## 2022-04-27 DIAGNOSIS — J029 Acute pharyngitis, unspecified: Secondary | ICD-10-CM | POA: Insufficient documentation

## 2022-04-27 DIAGNOSIS — B9789 Other viral agents as the cause of diseases classified elsewhere: Secondary | ICD-10-CM | POA: Insufficient documentation

## 2022-04-27 DIAGNOSIS — J069 Acute upper respiratory infection, unspecified: Secondary | ICD-10-CM | POA: Diagnosis not present

## 2022-04-27 LAB — RESP PANEL BY RT-PCR (FLU A&B, COVID) ARPGX2
Influenza A by PCR: NEGATIVE
Influenza B by PCR: NEGATIVE
SARS Coronavirus 2 by RT PCR: NEGATIVE

## 2022-04-27 LAB — GROUP A STREP BY PCR: Group A Strep by PCR: NOT DETECTED

## 2022-04-27 MED ORDER — FLUTICASONE PROPIONATE 50 MCG/ACT NA SUSP: 1.0000 | Freq: Two times a day (BID) | NASAL | 0 refills | Status: AC

## 2022-04-27 MED ORDER — PSEUDOEPH-BROMPHEN-DM 30-2-10 MG/5ML PO SYRP: 10.0000 mL | ORAL_SOLUTION | Freq: Four times a day (QID) | ORAL | 0 refills | Status: AC | PRN

## 2022-04-27 MED ORDER — CETIRIZINE HCL 10 MG PO TABS: 10.0000 mg | ORAL_TABLET | Freq: Every day | ORAL | 0 refills | Status: AC

## 2022-04-27 NOTE — ED Provider Notes (Signed)
[facility] Provider Note  Patient Contact: 9:41 PM (approximate)   History   Sore Throat   HPI  Phillip MCCLENTON is a 36 y.o. male who presents to the ED complaining of congestion, sore throat, cough x2 days.  Patient denies any fever, shortness of breath, GI complaints.  No medications prior to arrival.     Physical Exam   Triage Vital Signs: ED Triage Vitals  Enc Vitals Group     BP 04/27/22 1955 (!) 115/91     Pulse Rate 04/27/22 1955 86     Resp 04/27/22 1955 18     Temp 04/27/22 1955 98.2 F (36.8 C)     Temp Source 04/27/22 1955 Oral     SpO2 04/27/22 1955 99 %     Weight 04/27/22 1953 209 lb (94.8 kg)     Height 04/27/22 1953 5\' 9"  (1.753 m)     Head Circumference --      Peak Flow --      Pain Score 04/27/22 1953 10     Pain Loc --      Pain Edu? --      Excl. in GC? --     Most recent vital signs: Vitals:   04/27/22 1955  BP: (!) 115/91  Pulse: 86  Resp: 18  Temp: 98.2 F (36.8 C)  SpO2: 99%     General: Alert and in no acute distress. ENT:      Ears:       Nose: Positive congestion/rhinnorhea.      Mouth/Throat: Mucous membranes are moist.  No exudates or uvular deviation Neck: No stridor.   Cardiovascular:  Good peripheral perfusion Respiratory: Normal respiratory effort without tachypnea or retractions. Lungs CTAB. Good air entry to the bases with no decreased or absent breath sounds. Musculoskeletal: Full range of motion to all extremities.  Neurologic:  No gross focal neurologic deficits are appreciated.  Skin:   No rash noted Other:   ED Results / Procedures / Treatments   Labs (all labs ordered are listed, but only abnormal results are displayed) Labs Reviewed  RESP PANEL BY RT-PCR (FLU A&B, COVID) ARPGX2  GROUP A STREP BY PCR     EKG     RADIOLOGY  I personally viewed, evaluated, and interpreted these images as part of my medical decision making, as well as reviewing the written report by the  radiologist.  ED Provider Interpretation: No consolidation concerning for pneumonia  DG Chest Portable 1 View  Result Date: 04/27/2022 CLINICAL DATA:  Coughing, fatigue and sore throat. EXAM: PORTABLE CHEST 1 VIEW COMPARISON:  PA Lat 05/13/2020 FINDINGS: The heart size and mediastinal contours are within normal limits. Both lungs are clear. The visualized skeletal structures are unremarkable. IMPRESSION: No evidence of acute chest disease or interval changes. Electronically Signed   By: 07/13/2020 M.D.   On: 04/27/2022 20:49    PROCEDURES:  Critical Care performed: No  Procedures   MEDICATIONS ORDERED IN ED: Medications - No data to display   IMPRESSION / MDM / ASSESSMENT AND PLAN / ED COURSE  I reviewed the triage vital signs and the nursing notes.                              Differential diagnosis includes, but is not limited to, viral illness, COVID, flu, bronchitis, pneumonia, strep  Patient's presentation is most consistent with acute presentation with potential threat to  life or bodily function.   Patient's diagnosis is consistent with viral illness.  Patient presented to the ED with multiple symptoms of viral illness.  Negative COVID, flu, strep test.  Chest x-ray revealed no consolidation concerning for pneumonia.  Patient will be treated with symptom control medications for his viral illness.  Follow-up primary care as needed.. Patient is given ED precautions to return to the ED for any worsening or new symptoms.        FINAL CLINICAL IMPRESSION(S) / ED DIAGNOSES   Final diagnoses:  Viral URI with cough     Rx / DC Orders   ED Discharge Orders          Ordered    fluticasone (FLONASE) 50 MCG/ACT nasal spray  2 times daily        04/27/22 2146    cetirizine (ZYRTEC) 10 MG tablet  Daily        04/27/22 2146    brompheniramine-pseudoephedrine-DM 30-2-10 MG/5ML syrup  4 times daily PRN        04/27/22 2146             Note:  This document was  prepared using Dragon voice recognition software and may include unintentional dictation errors.   Lanette Hampshire 04/27/22 2147    Chesley Noon, MD 04/27/22 2241

## 2022-04-27 NOTE — ED Triage Notes (Signed)
Ambulatory to triage with c/o cough,fatigue, sore throat, and body aches since Sunday.

## 2023-07-25 ENCOUNTER — Emergency Department
Admission: EM | Admit: 2023-07-25 | Discharge: 2023-07-25 | Disposition: A | Discharge status: Home / Self Care | Payer: 59 | Attending: Emergency Medicine | Admitting: Emergency Medicine

## 2023-07-25 ENCOUNTER — Encounter: Payer: Self-pay | Admitting: Emergency Medicine

## 2023-07-25 DIAGNOSIS — Z1152 Encounter for screening for COVID-19: Secondary | ICD-10-CM | POA: Diagnosis not present

## 2023-07-25 DIAGNOSIS — J45909 Unspecified asthma, uncomplicated: Secondary | ICD-10-CM | POA: Diagnosis not present

## 2023-07-25 DIAGNOSIS — B349 Viral infection, unspecified: Secondary | ICD-10-CM

## 2023-07-25 DIAGNOSIS — J029 Acute pharyngitis, unspecified: Secondary | ICD-10-CM

## 2023-07-25 DIAGNOSIS — Z7951 Long term (current) use of inhaled steroids: Secondary | ICD-10-CM | POA: Diagnosis not present

## 2023-07-25 LAB — RESP PANEL BY RT-PCR (RSV, FLU A&B, COVID)  RVPGX2
Influenza A by PCR: NEGATIVE
Influenza B by PCR: NEGATIVE
Resp Syncytial Virus by PCR: NEGATIVE
SARS Coronavirus 2 by RT PCR: NEGATIVE

## 2023-07-25 LAB — GROUP A STREP BY PCR: Group A Strep by PCR: NOT DETECTED

## 2023-07-25 MED ORDER — AMOXICILLIN 500 MG PO CAPS: 500.0000 mg | ORAL_CAPSULE | Freq: Three times a day (TID) | ORAL | 0 refills | Status: AC

## 2023-07-25 MED ORDER — MAGIC MOUTHWASH
10.0000 mL | Freq: Once | ORAL | Status: AC
  Administered 2023-07-25: 10 mL via ORAL
  Filled 2023-07-25: qty 10

## 2023-07-25 MED ORDER — AMOXICILLIN 500 MG PO CAPS
500.0000 mg | ORAL_CAPSULE | Freq: Once | ORAL | Status: AC
  Administered 2023-07-25: 500 mg via ORAL
  Filled 2023-07-25: qty 1

## 2023-07-25 MED ORDER — MAGIC MOUTHWASH: ORAL | 0 refills | Status: AC

## 2023-07-25 NOTE — ED Triage Notes (Signed)
Pt c/o cough, sore throat, nasal congestion and chills x3 days without relief of OTC meds.
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
## 2023-07-25 NOTE — Discharge Instructions (Signed)
Take and finish antibiotic as prescribed.  You may use Magic mouthwash as needed for throat discomfort.  Return to the ER for worsening symptoms, persistent vomiting, difficulty breathing or other concerns.

## 2023-10-23 ENCOUNTER — Other Ambulatory Visit: Payer: Self-pay

## 2023-10-23 DIAGNOSIS — U071 COVID-19: Secondary | ICD-10-CM | POA: Insufficient documentation

## 2023-10-23 DIAGNOSIS — R509 Fever, unspecified: Secondary | ICD-10-CM | POA: Diagnosis present

## 2023-10-23 LAB — RESP PANEL BY RT-PCR (RSV, FLU A&B, COVID)  RVPGX2
Influenza A by PCR: NEGATIVE
Influenza B by PCR: NEGATIVE
Resp Syncytial Virus by PCR: NEGATIVE
SARS Coronavirus 2 by RT PCR: POSITIVE — AB

## 2023-10-23 MED ORDER — IBUPROFEN 600 MG PO TABS
600.0000 mg | ORAL_TABLET | Freq: Once | ORAL | Status: AC
  Administered 2023-10-23: 600 mg via ORAL
  Filled 2023-10-23: qty 1

## 2023-10-23 NOTE — ED Triage Notes (Signed)
Pt to ed from home via POV for flu like symptoms. Pt denies any N/V/D. Pt has not assessed his own temp at home  but has has chills. Pt is caox4, in no acute distress and ambulatory in triage.

## 2023-10-24 ENCOUNTER — Emergency Department
Admission: EM | Admit: 2023-10-24 | Discharge: 2023-10-24 | Disposition: A | Discharge status: Home / Self Care | Payer: 59 | Attending: Emergency Medicine | Admitting: Emergency Medicine

## 2023-10-24 DIAGNOSIS — U071 COVID-19: Secondary | ICD-10-CM

## 2023-10-24 NOTE — ED Provider Notes (Signed)
[facility] Provider Note    Event Date/Time   First MD Initiated Contact with Patient 10/24/23 [phone]     (approximate)   History   flu  like  symptoms   HPI Phillip Mcgee is a 38 y.o. male who presents for evaluation of about 24 hours of flulike symptoms that include headache, general malaise, body aches, fever, chills.  He is developed a mild cough just recently since he has been in the emergency department.  No difficulty breathing.  He denies nausea, vomiting, and diarrhea.  He has not been around anyone that has been ill of which she is aware.  He has had COVID once in the past and had a COVID vaccination quite sometime ago.     Physical Exam   Triage Vital Signs: ED Triage Vitals  Encounter Vitals Group     BP 10/23/23 2004 (!) 126/93     Systolic BP Percentile --      Diastolic BP Percentile --      Pulse Rate 10/23/23 2004 99     Resp 10/23/23 2004 18     Temp 10/23/23 2004 100.2 F (37.9 C)     Temp Source 10/23/23 2004 Oral     SpO2 10/23/23 2004 98 %     Weight --      Height --      Head Circumference --      Peak Flow --      Pain Score 10/23/23 2005 8     Pain Loc --      Pain Education --      Exclude from Growth Chart --     Most recent vital signs: Vitals:   10/23/23 2004  BP: (!) 126/93  Pulse: 99  Resp: 18  Temp: 100.2 F (37.9 C)  SpO2: 98%    General: Awake, no distress.  Generally well-appearing.   CV:  Good peripheral perfusion.  Regular rate and rhythm.  Normal heart sounds. Resp:  Normal effort. Speaking easily and comfortably, no accessory muscle usage nor intercostal retractions.  Lungs clear to auscultation bilaterally. Abd:  No distention.    ED Results / Procedures / Treatments   Labs (all labs ordered are listed, but only abnormal results are displayed) Labs Reviewed  RESP PANEL BY RT-PCR (RSV, FLU A&B, COVID)  RVPGX2 - Abnormal; Notable for the following components:      Result Value   SARS  Coronavirus 2 by RT PCR POSITIVE (*)    All other components within normal limits     PROCEDURES:  Critical Care performed: No  Procedures    IMPRESSION / MDM / ASSESSMENT AND PLAN / ED COURSE  I reviewed the triage vital signs and the nursing notes.                              Differential diagnosis includes, but is not limited to, viral illness, community-acquired pneumonia  Patient's presentation is most consistent with acute complicated illness / injury requiring diagnostic workup.  Labs/studies ordered: Respiratory viral panel PCR  Interventions/Medications given:  Medications  ibuprofen (ADVIL) tablet 600 mg (600 mg Oral Given 10/23/23 2009)    (Note:  hospital course my include additional interventions and/or labs/studies not listed above.)   Patient's vital signs are stable and within normal limits, just a slightly elevated temperature and borderline tachycardia.  PCR is positive for COVID-19.  I updated the patient, recommended  conservative measures, no clear role for outpatient COVID medications.  He understands and agrees with the plan.  I gave my usual and customary return precautions.         FINAL CLINICAL IMPRESSION(S) / ED DIAGNOSES   Final diagnoses:  COVID-19     Rx / DC Orders   ED Discharge Orders     None        Note:  This document was prepared using Dragon voice recognition software and may include unintentional dictation errors.   Phillip Rose, MD 10/24/23 [phone]

## 2024-01-25 ENCOUNTER — Emergency Department
Admission: EM | Admit: 2024-01-25 | Discharge: 2024-01-25 | Disposition: A | Discharge status: Home / Self Care | Attending: Emergency Medicine | Admitting: Emergency Medicine

## 2024-01-25 ENCOUNTER — Other Ambulatory Visit: Payer: Self-pay

## 2024-01-25 DIAGNOSIS — M5412 Radiculopathy, cervical region: Secondary | ICD-10-CM | POA: Diagnosis not present

## 2024-01-25 DIAGNOSIS — M25511 Pain in right shoulder: Secondary | ICD-10-CM | POA: Diagnosis present

## 2024-01-25 DIAGNOSIS — J45909 Unspecified asthma, uncomplicated: Secondary | ICD-10-CM | POA: Diagnosis not present

## 2024-01-25 MED ORDER — PREDNISONE 10 MG PO TABS: 10.0000 mg | ORAL_TABLET | Freq: Every day | ORAL | 0 refills | Status: AC

## 2024-01-25 MED ORDER — TRAMADOL HCL 50 MG PO TABS: 50.0000 mg | ORAL_TABLET | Freq: Four times a day (QID) | ORAL | 0 refills | Status: AC | PRN

## 2024-01-25 NOTE — ED Provider Notes (Signed)
   [facility] Provider Note    Event Date/Time   First MD Initiated Contact with Patient 01/25/24 1010     (approximate)  History   Chief Complaint: Shoulder Pain  HPI  Phillip Mcgee is a 38 y.o. male with a past medical asthma, gastric reflux, presents emergency department for right shoulder pain.  According to the patient for the last 3 weeks he has been experiencing pain in the right shoulder, worse with certain movements of the shoulder or if he turns his neck.  Describes at times a sharp shooting pain from the neck all the way down into the arm.  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  Denies any known injuries or trauma.  No chest pain or shortness of breath.  Physical Exam   Triage Vital Signs: ED Triage Vitals  Encounter Vitals Group     BP 01/25/24 0956 127/85     Systolic BP Percentile --      Diastolic BP Percentile --      Pulse Rate 01/25/24 0956 80     Resp 01/25/24 0956 18     Temp 01/25/24 0956 98.3 F (36.8 C)     Temp Source 01/25/24 0956 Oral     SpO2 01/25/24 0956 99 %     Weight 01/25/24 0954 213 lb (96.6 kg)     Height 01/25/24 0954 5\' 9"  (1.753 m)     Head Circumference --      Peak Flow --      Pain Score 01/25/24 0954 10     Pain Loc --      Pain Education --      Exclude from Growth Chart --     Most recent vital signs: Vitals:   01/25/24 0956  BP: 127/85  Pulse: 80  Resp: 18  Temp: 98.3 F (36.8 C)  SpO2: 99%    General: Awake, no distress.  CV:  Good peripheral perfusion.   Resp:  Normal effort.  Other:  Patient has mild tenderness to palpation over the right trapezius as well as right cervical paraspinal muscle.  Patient states pain is worse extending the right arm in front of him as well as with certain movements of the neck.  Neurovascularly intact distally.  No swelling of the arm.   ED Results / Procedures / Treatments   MEDICATIONS ORDERED IN ED: Medications - No data  to display   IMPRESSION / MDM / ASSESSMENT AND PLAN / ED COURSE  I reviewed the triage vital signs and the nursing notes.  Patient's presentation is most consistent with acute illness / injury with system symptoms.  Patient presents to the emergency department for right shoulder pain starts in the right neck radiates down the arm.  Patient states this has been ongoing for 3 weeks worse with certain neck movements or extending the arm in front of him.  Symptoms are very suggestive of cervical radiculopathy.  No other concerning findings on exam.  Reassuring vital signs.  We will start the patient on a prednisone taper as well as prescribe a short course of tramadol and an orthopedic referral for the patient.  Patient agreeable to plan of care.  FINAL CLINICAL IMPRESSION(S) / ED DIAGNOSES   Cervical radiculopathy   Note:  This document was prepared using Dragon voice recognition software and may include unintentional dictation errors.   Ruth Cove, MD 01/25/24 1024

## 2024-01-25 NOTE — ED Triage Notes (Signed)
 Pt c/o right shoulder pain not relieved by ice, OTC meds, or position x 3 weeks. 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 Pt CAOX4, CMS intact.

## 2024-06-12 ENCOUNTER — Encounter (HOSPITAL_COMMUNITY): Payer: Self-pay

## 2024-06-12 ENCOUNTER — Other Ambulatory Visit: Payer: Self-pay

## 2024-06-12 ENCOUNTER — Emergency Department (HOSPITAL_COMMUNITY)
Admission: EM | Admit: 2024-06-12 | Discharge: 2024-06-12 | Disposition: A | Discharge status: Home / Self Care | Attending: Emergency Medicine | Admitting: Emergency Medicine

## 2024-06-12 DIAGNOSIS — R197 Diarrhea, unspecified: Secondary | ICD-10-CM | POA: Diagnosis present

## 2024-06-12 DIAGNOSIS — R110 Nausea: Secondary | ICD-10-CM | POA: Diagnosis not present

## 2024-06-12 LAB — CBC WITH DIFFERENTIAL/PLATELET
Abs Immature Granulocytes: 0.04 K/uL (ref 0.00–0.07)
Basophils Absolute: 0 K/uL (ref 0.0–0.1)
Basophils Relative: 0 %
Eosinophils Absolute: 0.1 K/uL (ref 0.0–0.5)
Eosinophils Relative: 1 %
HCT: 40.3 % (ref 39.0–52.0)
Hemoglobin: 12.7 g/dL — ABNORMAL LOW (ref 13.0–17.0)
Immature Granulocytes: 1 %
Lymphocytes Relative: 24 %
Lymphs Abs: 2.1 K/uL (ref 0.7–4.0)
MCH: 26.1 pg (ref 26.0–34.0)
MCHC: 31.5 g/dL (ref 30.0–36.0)
MCV: 82.9 fL (ref 80.0–100.0)
Monocytes Absolute: 1.2 K/uL — ABNORMAL HIGH (ref 0.1–1.0)
Monocytes Relative: 14 %
Neutro Abs: 5.2 K/uL (ref 1.7–7.7)
Neutrophils Relative %: 60 %
Platelets: 350 K/uL (ref 150–400)
RBC: 4.86 MIL/uL (ref 4.22–5.81)
RDW: 16 % — ABNORMAL HIGH (ref 11.5–15.5)
WBC: 8.7 K/uL (ref 4.0–10.5)
nRBC: 0 % (ref 0.0–0.2)

## 2024-06-12 LAB — COMPREHENSIVE METABOLIC PANEL WITH GFR
ALT: 25 U/L (ref 0–44)
AST: 24 U/L (ref 15–41)
Albumin: 3.2 g/dL — ABNORMAL LOW (ref 3.5–5.0)
Alkaline Phosphatase: 38 U/L (ref 38–126)
Anion gap: 12 (ref 5–15)
BUN: 11 mg/dL (ref 6–20)
CO2: 23 mmol/L (ref 22–32)
Calcium: 8.5 mg/dL — ABNORMAL LOW (ref 8.9–10.3)
Chloride: 104 mmol/L (ref 98–111)
Creatinine, Ser: 1.19 mg/dL (ref 0.61–1.24)
GFR, Estimated: >60 mL/min (ref >60)
Glucose, Bld: 89 mg/dL (ref 70–99)
Potassium: 3.6 mmol/L (ref 3.5–5.1)
Sodium: 139 mmol/L (ref 135–145)
Total Bilirubin: 0.4 mg/dL (ref 0.0–1.2)
Total Protein: 5.9 g/dL — ABNORMAL LOW (ref 6.5–8.1)

## 2024-06-12 LAB — LIPASE, BLOOD: Lipase: 53 U/L — ABNORMAL HIGH (ref 11–51)

## 2024-06-12 MED ORDER — ONDANSETRON 4 MG PO TBDP: 4.0000 mg | ORAL_TABLET | Freq: Three times a day (TID) | ORAL | 0 refills | Status: AC | PRN

## 2024-06-12 MED ORDER — ONDANSETRON HCL 4 MG/2ML IJ SOLN
4.0000 mg | Freq: Once | INTRAMUSCULAR | Status: AC
  Administered 2024-06-12: 4 mg via INTRAVENOUS
  Filled 2024-06-12: qty 2

## 2024-06-12 MED ORDER — LACTATED RINGERS IV BOLUS
1000.0000 mL | Freq: Once | INTRAVENOUS | Status: AC
  Administered 2024-06-12: 1000 mL via INTRAVENOUS

## 2024-06-12 NOTE — ED Notes (Signed)
 Pt ask provider if he could eat. Provider said he is ok with him eating. Pt is eating a [place] sandwich and a ginger ale.

## 2024-06-12 NOTE — ED Triage Notes (Signed)
 Pt c/o upper abdominal pain that started yesterday, also reports nausea/diarrhea

## 2024-06-12 NOTE — ED Notes (Signed)
 Pt ambulatory to restroom to provide stool sample, but states he does not need to have bowel movement at this time. Pt requested and provided an additional sandwich bag.

## 2024-06-12 NOTE — ED Provider Triage Note (Signed)
 Emergency Medicine Provider Triage Evaluation Note  Phillip Mcgee , a 38 y.o. male  was evaluated in triage.  Pt complains of 3 days of abdominal pain associated with nausea and diarrhea.  Review of Systems  Positive: Abdominal pain Negative: Fever  Physical Exam  BP 103/65 (BP Location: Right Arm)   Pulse 77   Temp 98.2 F (36.8 C)   Resp 16   SpO2 98%  Gen:   Awake, no distress   Resp:  Normal effort  MSK:   Moves extremities without difficulty  Other:    Medical Decision Making  Medically screening exam initiated at 12:37 PM.  Appropriate orders placed.  Phillip Mcgee was informed that the remainder of the evaluation will be completed by another provider, this initial triage assessment does not replace that evaluation, and the importance of remaining in the ED until their evaluation is complete.     Phillip Ozell BROCKS, MD 06/12/24 [phone]

## 2024-06-12 NOTE — Discharge Instructions (Signed)
You were seen for your viral bug (gastroenteritis) in the emergency department.  At home, please take the Zofran for your nausea and vomiting. Please be sure to stay well-hydrated.  Follow-up with your primary doctor in 2-3 days regarding your visit.  Return immediately to the emergency department if you experience any of the following: fainting, abdominal pain, high fevers, or any other concerning symptoms.  Thank you for visiting our Emergency Department. It was a pleasure taking care of you today.
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
